# Patient Record
Sex: Female | Born: 1995 | Race: Black or African American | Hispanic: No | Marital: Single | Smoking: Never smoker
Health system: Southern US, Community
[De-identification: ages and names within clinical notes are randomized; demographics above are authoritative.]

## PROBLEM LIST (undated history)

## (undated) ENCOUNTER — Inpatient Hospital Stay (HOSPITAL_COMMUNITY): Payer: Self-pay

## (undated) DIAGNOSIS — J45909 Unspecified asthma, uncomplicated: Secondary | ICD-10-CM

## (undated) DIAGNOSIS — D649 Anemia, unspecified: Secondary | ICD-10-CM

## (undated) HISTORY — PX: NO PAST SURGERIES: SHX2092

---
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
## 2014-01-04 ENCOUNTER — Ambulatory Visit (INDEPENDENT_AMBULATORY_CARE_PROVIDER_SITE_OTHER): Payer: Medicaid Other | Admitting: Obstetrics & Gynecology

## 2014-01-04 ENCOUNTER — Encounter: Payer: Self-pay | Admitting: Obstetrics & Gynecology

## 2014-01-04 DIAGNOSIS — N760 Acute vaginitis: Secondary | ICD-10-CM

## 2014-01-04 MED ORDER — METRONIDAZOLE 500 MG PO TABS: 500.0000 mg | ORAL_TABLET | Freq: Two times a day (BID) | ORAL | Status: DC

## 2014-01-04 NOTE — Patient Instructions (Signed)

## 2014-01-04 NOTE — Progress Notes (Signed)
Patient ID: [place], [place]   DOB: 05-01-1996, 18 y.o.   MRN: [id number]  C/O vaginal discharge with an order  HPI [name] is a 18 y.o. female. H/O genital HSV.  C/O swollen labia.  HPI  No past medical history on file.  Past Surgical History  Procedure Laterality Date  . No past surgeries      Family History  Problem Relation Age of Onset  . Hypertension    . Diabetes    . Cancer Paternal Grandmother     Social History History  Substance Use Topics  . Smoking status: Never Smoker   . Smokeless tobacco: Never Used  . Alcohol Use: Yes     Comment: occ    No Known Allergies  Current Outpatient Prescriptions  Medication Sig Dispense Refill  . ibuprofen (ADVIL,MOTRIN) 600 MG tablet Take 1,200-1,800 mg by mouth every 6 (six) hours as needed. For pain      . medroxyPROGESTERone (DEPO-PROVERA) 150 MG/ML injection Inject 1 mL (150 mg total) into the muscle once.  1 mL  2  . traMADol (ULTRAM) 50 MG tablet Take 1 tablet (50 mg total) by mouth every 6 (six) hours as needed.  15 tablet  0   Current Facility-Administered Medications  Medication Dose Route Frequency Provider Last Rate Last Dose  . medroxyPROGESTERone (DEPO-PROVERA) injection 150 mg  150 mg Intramuscular Q90 days Brock Badharles A Harper, MD   150 mg at 11/14/13 1026    Review of Systems Review of Systems Constitutional: negative for fatigue and weight loss Respiratory: negative for cough and wheezing Cardiovascular: negative for chest pain, fatigue and palpitations Gastrointestinal: negative for abdominal pain and change in bowel habits Genitourinary: positive for abnormal discharge Integument/breast: negative for nipple discharge Musculoskeletal:negative for myalgias Neurological: negative for gait problems and tremors Behavioral/Psych: negative for abusive relationship, depression Endocrine: negative for temperature intolerance     There were no vitals taken for this visit.  Physical  Exam Physical Exam General:   alert  Skin:   no rash or abnormalities  Lungs:   clear to auscultation bilaterally  Heart:   regular rate and rhythm, S1, S2 normal, no murmur, click, rub or gallop  Breasts:   normal without suspicious masses, skin or nipple changes or axillary nodes  Abdomen:  normal findings: no organomegaly, soft, non-tender and no hernia  Pelvis:  External genitalia: raised, gray lesion < 1 at posterior fourchette Urinary system: urethral meatus normal and bladder without fullness, nontender Vaginal: scant, white discharge Cervix: normal appearance Adnexa: normal bimanual exam Uterus: anteverted and non-tender, normal size      Data Reviewed None  Assessment    Condylomata ?Vulvovaginitis    Plan   .meds Orders Placed This Encounter  Procedures  . WET PREP BY MOLECULAR PROBE  . GC/Chlamydia Probe Amp   Meds ordered this encounter  Medications  . metroNIDAZOLE (FLAGYL) 500 MG tablet    Sig: Take 1 tablet (500 mg total) by mouth 2 (two) times daily.    Dispense:  14 tablet    Refill:  0   Discontinue shaving of pubic hair  Follow up as needed.         JACKSON-MOORE,Hien Cunliffe A 01/04/2014, 11:35 AM

## 2014-01-05 LAB — WET PREP BY MOLECULAR PROBE
Candida species: NEGATIVE
Gardnerella vaginalis: POSITIVE — AB
Trichomonas vaginosis: NEGATIVE

## 2014-01-05 LAB — GC/CHLAMYDIA PROBE AMP
CT PROBE, AMP APTIMA: NEGATIVE
GC Probe RNA: NEGATIVE

## 2014-02-05 ENCOUNTER — Ambulatory Visit: Payer: Medicaid Other

## 2014-02-05 ENCOUNTER — Other Ambulatory Visit: Payer: Self-pay | Admitting: *Deleted

## 2014-02-05 DIAGNOSIS — Z3042 Encounter for surveillance of injectable contraceptive: Secondary | ICD-10-CM

## 2014-02-05 MED ORDER — MEDROXYPROGESTERONE ACETATE 150 MG/ML IM SUSP: 150.0000 mg | Freq: Once | INTRAMUSCULAR | Status: DC

## 2014-02-06 ENCOUNTER — Ambulatory Visit (INDEPENDENT_AMBULATORY_CARE_PROVIDER_SITE_OTHER): Payer: Medicaid Other | Admitting: *Deleted

## 2014-02-06 VITALS — BP 130/79 | HR 81 | Temp 98.6°F | Ht 60.0 in | Wt 118.0 lb

## 2014-02-06 DIAGNOSIS — Z309 Encounter for contraceptive management, unspecified: Secondary | ICD-10-CM

## 2014-02-06 DIAGNOSIS — Z3049 Encounter for surveillance of other contraceptives: Secondary | ICD-10-CM

## 2014-02-06 NOTE — Progress Notes (Signed)
Patient in office for a Depo Injection. Patient is on time for her Depo Injection. Patient tolerated injection well.  Patient due back in office April 30, 2014 for next injection.   BP 130/79  Pulse 81  Temp(Src) 98.6 F (37 C)  Ht 5' (1.524 m)  Wt 118 lb (53.524 kg)  BMI 23.05 kg/m2  LMP 02/03/2014  Administrations This Visit   medroxyPROGESTERone (DEPO-PROVERA) injection 150 mg   Administered Action Dose Route Administered By   02/06/2014 Given 150 mg Intramuscular Shelda Pal, LPN

## 2014-03-06 ENCOUNTER — Telehealth: Payer: Self-pay | Admitting: *Deleted

## 2014-03-06 NOTE — Telephone Encounter (Signed)
Pharmacy faxed over a refill request for Ibuprofen 600 MG Tablets, Qty: 30 with 5 Refills. Please advise on Refill request.

## 2014-03-09 ENCOUNTER — Other Ambulatory Visit: Payer: Self-pay | Admitting: *Deleted

## 2014-03-09 MED ORDER — IBUPROFEN 800 MG PO TABS: 800.0000 mg | ORAL_TABLET | Freq: Three times a day (TID) | ORAL | Status: DC | PRN

## 2014-03-10 NOTE — Telephone Encounter (Signed)
OK to refill

## 2014-03-13 NOTE — Telephone Encounter (Signed)
Refills sent by [name] on 03/09/14.

## 2014-04-24 ENCOUNTER — Emergency Department (HOSPITAL_COMMUNITY)
Admission: EM | Admit: 2014-04-24 | Discharge: 2014-04-24 | Disposition: A | Discharge status: Home / Self Care | Payer: Medicaid Other | Attending: Emergency Medicine | Admitting: Emergency Medicine

## 2014-04-24 ENCOUNTER — Encounter (HOSPITAL_COMMUNITY): Payer: Self-pay | Admitting: Emergency Medicine

## 2014-04-24 DIAGNOSIS — R51 Headache: Secondary | ICD-10-CM | POA: Diagnosis not present

## 2014-04-24 DIAGNOSIS — B3731 Acute candidiasis of vulva and vagina: Secondary | ICD-10-CM

## 2014-04-24 DIAGNOSIS — B373 Candidiasis of vulva and vagina: Secondary | ICD-10-CM | POA: Insufficient documentation

## 2014-04-24 DIAGNOSIS — Z3202 Encounter for pregnancy test, result negative: Secondary | ICD-10-CM | POA: Diagnosis not present

## 2014-04-24 DIAGNOSIS — Z7952 Long term (current) use of systemic steroids: Secondary | ICD-10-CM | POA: Diagnosis not present

## 2014-04-24 DIAGNOSIS — R040 Epistaxis: Secondary | ICD-10-CM | POA: Diagnosis not present

## 2014-04-24 DIAGNOSIS — N390 Urinary tract infection, site not specified: Secondary | ICD-10-CM | POA: Insufficient documentation

## 2014-04-24 DIAGNOSIS — R635 Abnormal weight gain: Secondary | ICD-10-CM | POA: Insufficient documentation

## 2014-04-24 DIAGNOSIS — R109 Unspecified abdominal pain: Secondary | ICD-10-CM | POA: Diagnosis present

## 2014-04-24 LAB — COMPREHENSIVE METABOLIC PANEL
ALBUMIN: 4.5 g/dL (ref 3.5–5.2)
ALT: 10 U/L (ref 0–35)
AST: 15 U/L (ref 0–37)
Alkaline Phosphatase: 69 U/L (ref 39–117)
Anion gap: 14 (ref 5–15)
BILIRUBIN TOTAL: 0.8 mg/dL (ref 0.3–1.2)
BUN: 17 mg/dL (ref 6–23)
CHLORIDE: 105 meq/L (ref 96–112)
CO2: 22 meq/L (ref 19–32)
Calcium: 10.1 mg/dL (ref 8.4–10.5)
Creatinine, Ser: 1.08 mg/dL (ref 0.50–1.10)
GFR calc Af Amer: 86 mL/min — ABNORMAL LOW (ref >90)
GFR, EST NON AFRICAN AMERICAN: 74 mL/min — AB (ref >90)
Glucose, Bld: 96 mg/dL (ref 70–99)
POTASSIUM: 4.1 meq/L (ref 3.7–5.3)
SODIUM: 141 meq/L (ref 137–147)
Total Protein: 8.2 g/dL (ref 6.0–8.3)

## 2014-04-24 LAB — CBC WITH DIFFERENTIAL/PLATELET
BASOS ABS: 0 10*3/uL (ref 0.0–0.1)
Basophils Relative: 0 % (ref 0–1)
Eosinophils Absolute: 0.1 10*3/uL (ref 0.0–0.7)
Eosinophils Relative: 1 % (ref 0–5)
HEMATOCRIT: 40.6 % (ref 36.0–46.0)
Hemoglobin: 13.3 g/dL (ref 12.0–15.0)
LYMPHS PCT: 45 % (ref 12–46)
Lymphs Abs: 2.3 10*3/uL (ref 0.7–4.0)
MCH: 27.1 pg (ref 26.0–34.0)
MCHC: 32.8 g/dL (ref 30.0–36.0)
MCV: 82.7 fL (ref 78.0–100.0)
Monocytes Absolute: 0.3 10*3/uL (ref 0.1–1.0)
Monocytes Relative: 6 % (ref 3–12)
NEUTROS ABS: 2.5 10*3/uL (ref 1.7–7.7)
NEUTROS PCT: 48 % (ref 43–77)
PLATELETS: 237 10*3/uL (ref 150–400)
RBC: 4.91 MIL/uL (ref 3.87–5.11)
RDW: 14 % (ref 11.5–15.5)
WBC: 5.2 10*3/uL (ref 4.0–10.5)

## 2014-04-24 LAB — URINALYSIS, ROUTINE W REFLEX MICROSCOPIC
Bilirubin Urine: NEGATIVE
GLUCOSE, UA: NEGATIVE mg/dL
Hgb urine dipstick: NEGATIVE
Ketones, ur: NEGATIVE mg/dL
NITRITE: NEGATIVE
PROTEIN: NEGATIVE mg/dL
Specific Gravity, Urine: 1.028 (ref 1.005–1.030)
UROBILINOGEN UA: 0.2 mg/dL (ref 0.0–1.0)
pH: 5.5 (ref 5.0–8.0)

## 2014-04-24 LAB — URINE MICROSCOPIC-ADD ON

## 2014-04-24 LAB — WET PREP, GENITAL
Clue Cells Wet Prep HPF POC: NONE SEEN
Trich, Wet Prep: NONE SEEN
WBC WET PREP: NONE SEEN
Yeast Wet Prep HPF POC: NONE SEEN

## 2014-04-24 LAB — POC URINE PREG, ED: Preg Test, Ur: NEGATIVE

## 2014-04-24 MED ORDER — FLUCONAZOLE 150 MG PO TABS: 150.0000 mg | ORAL_TABLET | Freq: Once | ORAL | Status: DC

## 2014-04-24 MED ORDER — CEPHALEXIN 500 MG PO CAPS: 500.0000 mg | ORAL_CAPSULE | Freq: Four times a day (QID) | ORAL | Status: DC

## 2014-04-24 NOTE — ED Notes (Signed)
Per pt, states abdominal pain, headache, increased urination since Depo shot in August

## 2014-04-24 NOTE — Discharge Instructions (Signed)
Candida Infection A Candida infection (also called yeast, fungus, and Monilia infection) is an overgrowth of yeast that can occur anywhere on the body. A yeast infection commonly occurs in warm, moist body areas. Usually, the infection remains localized but can spread to become a systemic infection. A yeast infection may be a sign of a more severe disease such as diabetes, leukemia, or AIDS. A yeast infection can occur in both men and women. In women, Candida vaginitis is a vaginal infection. It is one of the most common causes of vaginitis. Men usually do not have symptoms or know they have an infection until other problems develop. Men may find out they have a yeast infection because their sex partner has a yeast infection. Uncircumcised men are more likely to get a yeast infection than circumcised men. This is because the uncircumcised glans is not exposed to air and does not remain as dry as that of a circumcised glans. Older adults may develop yeast infections around dentures. CAUSES  Women  Antibiotics.  Steroid medication taken for a long time.  Being overweight (obese).  Diabetes.  Poor immune condition.  Certain serious medical conditions.  Immune suppressive medications for organ transplant patients.  Chemotherapy.  Pregnancy.  Menstruation.  Stress and fatigue.  Intravenous drug use.  Oral contraceptives.  Wearing tight-fitting clothes in the crotch area.  Catching it from a sex partner who has a yeast infection.  Spermicide.  Intravenous, urinary, or other catheters. Men  Catching it from a sex partner who has a yeast infection.  Having oral or anal sex with a person who has the infection.  Spermicide.  Diabetes.  Antibiotics.  Poor immune system.  Medications that suppress the immune system.  Intravenous drug use.  Intravenous, urinary, or other catheters. SYMPTOMS  Women  Thick, white vaginal discharge.  Vaginal itching.  Redness and  swelling in and around the vagina.  Irritation of the lips of the vagina and perineum.  Blisters on the vaginal lips and perineum.  Painful sexual intercourse.  Low blood sugar (hypoglycemia).  Painful urination.  Bladder infections.  Intestinal problems such as constipation, indigestion, bad breath, bloating, increase in gas, diarrhea, or loose stools. Men  Men may develop intestinal problems such as constipation, indigestion, bad breath, bloating, increase in gas, diarrhea, or loose stools.  Dry, cracked skin on the penis with itching or discomfort.  Jock itch.  Dry, flaky skin.  Athlete's foot.  Hypoglycemia. DIAGNOSIS  Women  A history and an exam are performed.  The discharge may be examined under a microscope.  A culture may be taken of the discharge. Men  A history and an exam are performed.  Any discharge from the penis or areas of cracked skin will be looked at under the microscope and cultured.  Stool samples may be cultured. TREATMENT  Women  Vaginal antifungal suppositories and creams.  Medicated creams to decrease irritation and itching on the outside of the vagina.  Warm compresses to the perineal area to decrease swelling and discomfort.  Oral antifungal medications.  Medicated vaginal suppositories or cream for repeated or recurrent infections.  Wash and dry the irritation areas before applying the cream.  Eating yogurt with Lactobacillus may help with prevention and treatment.  Sometimes painting the vagina with gentian violet solution may help if creams and suppositories do not work. Men  Antifungal creams and oral antifungal medications.  Sometimes treatment must continue for 30 days after the symptoms go away to prevent recurrence. HOME CARE INSTRUCTIONS  Women  Use cotton underwear and avoid tight-fitting clothing.  Avoid colored, scented toilet paper and deodorant tampons or pads.  Do not douche.  Keep your diabetes  under control.  Finish all the prescribed medications.  Keep your skin clean and dry.  Consume milk or yogurt with Lactobacillus-active culture regularly. If you get frequent yeast infections and think that is what the infection is, there are over-the-counter medications that you can get. If the infection does not show healing in 3 days, talk to your caregiver.  Tell your sex partner you have a yeast infection. Your partner may need treatment also, especially if your infection does not clear up or recurs. Men  Keep your skin clean and dry.  Keep your diabetes under control.  Finish all prescribed medications.  Tell your sex partner that you have a yeast infection so he or she can be treated if necessary. SEEK MEDICAL CARE IF:   Your symptoms do not clear up or worsen in one week after treatment.  You have an oral temperature above 102 F (38.9 C).  You have trouble swallowing or eating for a prolonged time.  You develop blisters on and around your vagina.  You develop vaginal bleeding and it is not your menstrual period.  You develop abdominal pain.  You develop intestinal problems as mentioned above.  You get weak or light-headed.  You have painful or increased urination.  You have pain during sexual intercourse. MAKE SURE YOU:   Understand these instructions.  Will watch your condition.  Will get help right away if you are not doing well or get worse. Document Released: 07/09/2004 Document Revised: 10/16/2013 Document Reviewed: 10/21/2009 [facility] Patient Information 2015 [place], [place]. This information is not intended to replace advice given to you by your health care provider. Make sure you discuss any questions you have with your health care provider.  Urinary Tract Infection Urinary tract infections (UTIs) can develop anywhere along your urinary tract. Your urinary tract is your body's drainage system for removing wastes and extra water. Your urinary tract  includes two kidneys, two ureters, a bladder, and a urethra. Your kidneys are a pair of bean-shaped organs. Each kidney is about the size of your fist. They are located below your ribs, one on each side of your spine. CAUSES Infections are caused by microbes, which are microscopic organisms, including fungi, viruses, and bacteria. These organisms are so small that they can only be seen through a microscope. Bacteria are the microbes that most commonly cause UTIs. SYMPTOMS  Symptoms of UTIs may vary by age and gender of the patient and by the location of the infection. Symptoms in young women typically include a frequent and intense urge to urinate and a painful, burning feeling in the bladder or urethra during urination. Older women and men are more likely to be tired, shaky, and weak and have muscle aches and abdominal pain. A fever may mean the infection is in your kidneys. Other symptoms of a kidney infection include pain in your back or sides below the ribs, nausea, and vomiting. DIAGNOSIS To diagnose a UTI, your caregiver will ask you about your symptoms. Your caregiver also will ask to provide a urine sample. The urine sample will be tested for bacteria and white blood cells. White blood cells are made by your body to help fight infection. TREATMENT  Typically, UTIs can be treated with medication. Because most UTIs are caused by a bacterial infection, they usually can be treated with the use  of antibiotics. The choice of antibiotic and length of treatment depend on your symptoms and the type of bacteria causing your infection. HOME CARE INSTRUCTIONS  If you were prescribed antibiotics, take them exactly as your caregiver instructs you. Finish the medication even if you feel better after you have only taken some of the medication.  Drink enough water and fluids to keep your urine clear or pale yellow.  Avoid caffeine, tea, and carbonated beverages. They tend to irritate your bladder.  Empty  your bladder often. Avoid holding urine for long periods of time.  Empty your bladder before and after sexual intercourse.  After a bowel movement, women should cleanse from front to back. Use each tissue only once. SEEK MEDICAL CARE IF:   You have back pain.  You develop a fever.  Your symptoms do not begin to resolve within 3 days. SEEK IMMEDIATE MEDICAL CARE IF:   You have severe back pain or lower abdominal pain.  You develop chills.  You have nausea or vomiting.  You have continued burning or discomfort with urination. MAKE SURE YOU:   Understand these instructions.  Will watch your condition.  Will get help right away if you are not doing well or get worse. Document Released: 03/11/2005 Document Revised: 12/01/2011 Document Reviewed: 07/10/2011 [facility] Patient Information 2015 [place]. This information is not intended to replace advice given to you by your health care provider. Make sure you discuss any questions you have with your health care provider.   Emergency Department Resource Guide 1) Find a Doctor and Pay Out of Pocket Although you won't have to find out who is covered by your insurance plan, it is a good idea to ask around and get recommendations. You will then need to call the office and see if the doctor you have chosen will accept you as a new patient and what types of options they offer for patients who are self-pay. Some doctors offer discounts or will set up payment plans for their patients who do not have insurance, but you will need to ask so you aren't surprised when you get to your appointment.  2) Contact Your Local Health Department Not all health departments have doctors that can see patients for sick visits, but many do, so it is worth a call to see if yours does. If you don't know where your local health department is, you can check in your phone book. The CDC also has a tool to help you locate your state's health department, and many  state websites also have listings of all of their local health departments.  3) Find a Walk-in Clinic If your illness is not likely to be very severe or complicated, you may want to try a walk in clinic. These are popping up all over the country in pharmacies, drugstores, and shopping centers. They're usually staffed by nurse practitioners or physician assistants that have been trained to treat common illnesses and complaints. They're usually fairly quick and inexpensive. However, if you have serious medical issues or chronic medical problems, these are probably not your best option.  No Primary Care Doctor: - Call Health Connect at  [phone] - they can help you locate a primary care doctor that  accepts your insurance, provides certain services, etc. - Physician Referral Service- [phone]  Chronic Pain Problems: Organization         Address  Phone   Notes  [facility]  [phone] Patients need to be referred by their  primary care doctor.   Medication Assistance: Organization         Address  Phone   Notes  [facility] Medication [facility] [address] [place] [place], [place] [address] [phone] --Must be a resident of [facility] -- Must have NO insurance coverage whatsoever (no Medicaid/ Medicare, etc.) -- The pt. MUST have a primary care doctor that directs their care regularly and follows them in the community   Med[facility]ist  [phone]   [employer]  [phone]    Agencies that provide inexpensive medical care: Organization         Address  Phone   Notes  [facility]  [phone]   [facility]    [phone]   [facility] [address] [place], [place] [address] [phone]   [facility] [place] [address] [place] [place] [phone]   Planned Parenthood    [phone]   [facility]    [phone]   Community Health and  [facility]  [address], [place] Phone:  [phone], Fax:  [phone] Hours of Operation:  9 am - 6 pm, M-F.  Also accepts Medicaid/Medicare and self-pay.  [facility]  [address], [place] Phone: [phone], Fax: [phone]. Hours of Operation:  8:30 am - 5:30 pm, M-F.  Also accepts Medicaid and self-pay.  [facility] [place] [address], [place] Phone: [phone]   [facility] [address] Natasha Bence [place], [place] [phone], Ext. [phone] Mondays & Thursdays: 7-9 AM.  First 15 patients are seen on a first come, first serve basis.    Medicaid-accepting [facility] Providers:  Organization         Address  Phone   Notes  [facility] [address], [place] [phone] Also accepts self-pay patients.  [facility] [address] Laurell Josephs [place], [place]  [phone]   [facility] [address], [place] [phone]   [facility] [address] [place] [phone]   Renaye Rakers [address], [place]   [phone] Only accepts [place] patients after they have their name applied to their card.   Self-Pay (no insurance) in [facility]:  Organization         Address  Phone   Notes  Sickle Cell Patients, [facility] [address] [place], [place] [phone]   [facility] [address] [place], [place] [phone]   [facility] [place]  [address], [place] [phone]   [facility]/Dr. Osei-Bonsu  [address], [place] or [address], [place] [phone] Phone number for both [place] and [place] locations is the same.  [facility] [address], [place] [phone]   [facility] [address] [place] or [address] [phone]   [facility] [address] [place] [phone], phone; [phone], fax Sees patients 1st and 3rd Saturday of every month.  Must not qualify for public or private insurance (i.e. Medicaid, Medicare, [place] Health Choice, Veterans' Benefits)  Household income should be no more than 200%  of the poverty level The clinic cannot treat you if you are pregnant or think you are pregnant  Sexually transmitted diseases are not treated at the clinic.    Dental Care: Organization         Address  Phone  Notes  [facility] Department of [facility] [address] [place], [place] [phone] Accepts children up to age 42 who are enrolled in [place] or [place] Health Choice; pregnant women with a Medicaid card; and children who have applied for Medicaid or [place] Health Choice, but were declined, whose parents can pay a reduced fee at time of service.  [facility] Department of [facility]  [address], [place] [phone] Accepts children up to age 71 who are enrolled in [place] or [place] Health Choice; pregnant women with a Medicaid card; and children who have applied for Medicaid or [place] Health Choice, but were declined, whose parents can pay a reduced fee at time of service.  [facility]  [address] [place], [place] [phone] Patients are seen by appointment only. Walk-ins are not accepted. [facility] will see patients 55 years of age and older. Monday - Tuesday (8am-5pm) Most Wednesdays (8:30-5pm) $30 per visit, cash only  [facility] Adult Dental Access PRO[facility]  [address], [facility] [phone] Patients are seen by appointment only. Walk-ins are not accepted. [facility] will see patients 61 years of age and older. One Wednesday Evening (Monthly: Volunteer Based).  $30 per visit, cash only  [employer]  [phone] for adults; Children under age 51, call Graduate Pediatric Dentistry at [phone]. Children aged 4-14, please call [phone] to request a pediatric application.  Dental services are provided in all areas of dental care including fillings, crowns and bridges, complete and partial dentures, implants, gum treatment, root canals, and extractions. Preventive care is also provided. Treatment is provided to both adults and children. Patients are selected via a lottery and there is often a waiting list.   [facility] [address], [place]  [phone] [email or web address]   [facility] [address] [place], [place] [phone], Ext. [phone] Second and Fourth Thursday of each month, opens at 6:30 AM; Clinic ends at 9 AM.  Patients are seen on a first-come first-served basis, and a limited number are seen during each clinic.   [facility]  [address] Ether Griffins [place], [place] [phone]   Eligibility Requirements You must have lived in [place], [place], or [place] counties for at least the last three months.   You cannot be eligible for state or federal sponsored [place], including [employer], [place], or [employer].   You generally cannot be eligible for healthcare insurance through your employer.    How to apply: Eligibility screenings are held every Tuesday and Wednesday afternoon from 1:00 pm until 4:00 pm. You do not need an appointment for the interview!  [facility] [address], [place], [place] [phone]   [facility] Health Department  [phone]   [facility] Department  [phone]   [facility] Health Department  [phone]    Behavioral Health Resources in the Community: Intensive Outpatient Programs Organization         Address  Phone  Notes  [facility] [address], [place], [place]  [phone]   [facility]patient  [address], [place], [place] [phone]   [facility] [address], [place], [place]  [phone]   [facility] [address],  [place], [place] [phone] or [phone]   Substance Abuse Resources Organization         Address  Phone  Notes  [facility]  [phone]   [facility]  [phone]   [facility]  [phone]   [facility]  [phone]   Residential & Outpatient Substance Abuse Program  [phone]   Psychological Services Organization         Address  Phone  Notes  [facility]  [phone]   [facility]  [phone]- [phone]   [facility] [address], [place] [phone] or [phone]    Mobile Crisis Teams Organization         Address  Phone  Notes  [facility], Mobile Crisis Care Unit  [phone]   [facility]  [address] [place], [place] [phone]   Doristine LocksSharon DeEsch [address] [place] [phone]    Self-Help/Support Groups Organization         Address  Phone             Notes  [facility] [place] - variety of support groups  336- [phone] Call for more information  Narcotics Anonymous (NA), [facility] [address], [place]  2 meetings at this location   Statisticianesidential Treatment Programs Organization         Address  Phone  Notes  [facility] [address],    [place]  [phone]   [facility]  [address], [place] [phone], [place], [place] [phone]   [facility] [address], [place] [phone] Admissions: 8am-3pm M-F  [facility] [address]    [place], [place] [phone]   [facility] [address], [place], [place] [phone]   [facility] [address]    [place], [place] [phone]   [facility] - Intensive Outpatient [address], [place], [place] [phone]   [facility] ([facility] [address]  [place], [place] [phone] or [phone]   [facility] ([facility]) [address] [place], [place] [phone] Accepts Medicaid  [facility] [address]  [place] [phone] Substance Abuse/Addiction Treatment   [facility] Health Resources Organization         Address  Phone  Notes  [facility]  [phone]   Angie FavaJulie Brannon, PhD [address], [address] [place], [place]   [phone] or [phone]   [facility]   [address] [place], [place] [phone]   [facility] [address], [place], [place] [phone] Insurance/Medicaid/sponsorship through [facility]  [facility] [address]                                    [place], [place] [phone] Therapy/tele-psych/case  [facility] [address].   [place], [place] [phone]    Dr. Lolly MustacheArfeen  [phone]   [facility] [place]  [employer] [facility] 1) [address], [place] 2) [address], [place] 3)  [address] [place] [address], [place] [phone] °([phone] ° °([phone]   °[place] Child Abuse Hotline (336) [phone] or [phone] (After Hours)    ° ° ° °

## 2014-04-24 NOTE — ED Provider Notes (Signed)
CSN: [id number]     Arrival date & time 04/24/14  1122 History   First MD Initiated Contact with Patient 04/24/14 1233     Chief Complaint  Patient presents with  . Abdominal Pain     (Consider location/radiation/quality/duration/timing/severity/associated sxs/prior Treatment) HPI Comments: Pt presents with multiple complaints including suprapubic pain, dysuria, frequency, nose bleeds, headache, weight gain, hunger, vaginal discharge since starting depo in Aug.  The history is provided by the patient. No language interpreter was used.    History reviewed. No pertinent past medical history. Past Surgical History  Procedure Laterality Date  . No past surgeries     Family History  Problem Relation Age of Onset  . Hypertension    . Diabetes    . Cancer Paternal Grandmother    History  Substance Use Topics  . Smoking status: Never Smoker   . Smokeless tobacco: Never Used  . Alcohol Use: Yes     Comment: occ   OB History    Gravida Para Term Preterm AB TAB SAB Ectopic Multiple Living   0 0 0 0 0 0 0 0 0 0      Review of Systems    Allergies  Review of patient's allergies indicates no known allergies.  Home Medications   Prior to Admission medications   Medication Sig Start Date End Date Taking? Authorizing Provider  ibuprofen (ADVIL,MOTRIN) 800 MG tablet Take 1 tablet (800 mg total) by mouth every 8 (eight) hours as needed. 03/09/14  Yes Brock Badharles A Harper, MD  medroxyPROGESTERone (DEPO-PROVERA) 150 MG/ML injection Inject 1 mL (150 mg total) into the muscle once. 02/05/14  Yes Antionette CharLisa Jackson-Moore, MD  cephALEXin (KEFLEX) 500 MG capsule Take 1 capsule (500 mg total) by mouth 4 (four) times daily. 04/24/14   Toy CookeyMegan Renea Schoonmaker, MD  fluconazole (DIFLUCAN) 150 MG tablet Take 1 tablet (150 mg total) by mouth once. 04/24/14   Toy CookeyMegan Michaelia Beilfuss, MD   BP 118/64 mmHg  Pulse 71  Temp(Src) 98.5 F (36.9 C) (Oral)  Resp 16  SpO2 100%  LMP 03/01/2014 Physical Exam  Genitourinary:  Cervix exhibits discharge (thick, clumpy, white). Cervix exhibits no motion tenderness and no friability.    ED Course  Procedures (including critical care time) Labs Review Labs Reviewed  URINALYSIS, ROUTINE W REFLEX MICROSCOPIC - Abnormal; Notable for the following:    APPearance CLOUDY (*)    Leukocytes, UA MODERATE (*)    All other components within normal limits  COMPREHENSIVE METABOLIC PANEL - Abnormal; Notable for the following:    GFR calc non Af Amer 74 (*)    GFR calc Af Amer 86 (*)    All other components within normal limits  WET PREP, GENITAL  GC/CHLAMYDIA PROBE AMP  CBC WITH DIFFERENTIAL  URINE MICROSCOPIC-ADD ON  POC URINE PREG, ED    Imaging Review No results found.   EKG Interpretation None      MDM   Final diagnoses:  UTI (lower urinary tract infection)  Candidiasis of female genitalia    Pt is a 18 y.o. female with Pmhx as above who presents with multiple complaints including suprapubic pain, dysuria, frequency, nose bleeds, headache, weight gain, hunger, vaginal discharge since starting depo in Aug. On PE, VSS, pt in NAD. Cardiopulm & abdominal exam benign. Pelvic with thick, white clumpy discharge c/w candidiasis (will treat thought wet prep neg). UA likely infected. CBC, CMP grossly unremarkable. I have had discussion with pt about alternative forms of contraceptions and will rec  She f/u with her  GYN. Will also treat for UTI. Return precautions given for new or worsening symptoms including worsening pain, fever, inability to tolerate liquids.          Toy CookeyMegan Sacha Radloff, MD 04/24/14 2038

## 2014-04-25 LAB — GC/CHLAMYDIA PROBE AMP
CT PROBE, AMP APTIMA: NEGATIVE
GC PROBE AMP APTIMA: NEGATIVE

## 2014-04-30 ENCOUNTER — Ambulatory Visit: Payer: Self-pay

## 2014-05-01 ENCOUNTER — Ambulatory Visit: Payer: Self-pay

## 2014-06-04 ENCOUNTER — Emergency Department (HOSPITAL_COMMUNITY)
Admission: EM | Admit: 2014-06-04 | Discharge: 2014-06-04 | Disposition: A | Discharge status: Home / Self Care | Payer: Medicaid Other | Attending: Emergency Medicine | Admitting: Emergency Medicine

## 2014-06-04 ENCOUNTER — Encounter (HOSPITAL_COMMUNITY): Payer: Self-pay | Admitting: *Deleted

## 2014-06-04 DIAGNOSIS — Z79899 Other long term (current) drug therapy: Secondary | ICD-10-CM | POA: Insufficient documentation

## 2014-06-04 DIAGNOSIS — Z3202 Encounter for pregnancy test, result negative: Secondary | ICD-10-CM | POA: Diagnosis not present

## 2014-06-04 DIAGNOSIS — Z792 Long term (current) use of antibiotics: Secondary | ICD-10-CM | POA: Diagnosis not present

## 2014-06-04 DIAGNOSIS — N898 Other specified noninflammatory disorders of vagina: Secondary | ICD-10-CM | POA: Insufficient documentation

## 2014-06-04 LAB — URINE MICROSCOPIC-ADD ON

## 2014-06-04 LAB — URINALYSIS, ROUTINE W REFLEX MICROSCOPIC
BILIRUBIN URINE: NEGATIVE
GLUCOSE, UA: NEGATIVE mg/dL
KETONES UR: NEGATIVE mg/dL
Nitrite: NEGATIVE
PROTEIN: NEGATIVE mg/dL
Specific Gravity, Urine: 1.027 (ref 1.005–1.030)
Urobilinogen, UA: 0.2 mg/dL (ref 0.0–1.0)
pH: 8 (ref 5.0–8.0)

## 2014-06-04 LAB — WET PREP, GENITAL
TRICH WET PREP: NONE SEEN
YEAST WET PREP: NONE SEEN

## 2014-06-04 LAB — POC URINE PREG, ED: Preg Test, Ur: NEGATIVE

## 2014-06-04 MED ORDER — CEFTRIAXONE SODIUM 250 MG IJ SOLR
250.0000 mg | Freq: Once | INTRAMUSCULAR | Status: AC
  Administered 2014-06-04: 250 mg via INTRAMUSCULAR
  Filled 2014-06-04: qty 250

## 2014-06-04 MED ORDER — LIDOCAINE HCL 1 % IJ SOLN
INTRAMUSCULAR | Status: AC
  Administered 2014-06-04: 2 mL
  Filled 2014-06-04: qty 20

## 2014-06-04 MED ORDER — AZITHROMYCIN 250 MG PO TABS
1000.0000 mg | ORAL_TABLET | Freq: Once | ORAL | Status: AC
  Administered 2014-06-04: 1000 mg via ORAL
  Filled 2014-06-04: qty 4

## 2014-06-04 MED ORDER — METRONIDAZOLE 500 MG PO TABS: 500.0000 mg | ORAL_TABLET | Freq: Two times a day (BID) | ORAL | Status: DC

## 2014-06-04 MED ORDER — FLUCONAZOLE 150 MG PO TABS: 150.0000 mg | ORAL_TABLET | Freq: Once | ORAL | Status: DC

## 2014-06-04 NOTE — Discharge Instructions (Signed)
Sexually Transmitted Disease °A sexually transmitted disease (STD) is a disease or infection that may be passed (transmitted) from person to person, usually during sexual activity. This may happen by way of saliva, semen, blood, vaginal mucus, or urine. Common STDs include:  °· Gonorrhea.   °· Chlamydia.   °· Syphilis.   °· HIV and AIDS.   °· Genital herpes.   °· Hepatitis B and C.   °· Trichomonas.   °· Human papillomavirus (HPV).   °· Pubic lice.   °· Scabies. °· Mites. °· Bacterial vaginosis. °WHAT ARE CAUSES OF STDs? °An STD may be caused by bacteria, a virus, or parasites. STDs are often transmitted during sexual activity if one person is infected. However, they may also be transmitted through nonsexual means. STDs may be transmitted after:  °· Sexual intercourse with an infected person.   °· Sharing sex toys with an infected person.   °· Sharing needles with an infected person or using unclean piercing or tattoo needles. °· Having intimate contact with the genitals, mouth, or rectal areas of an infected person.   °· Exposure to infected fluids during birth. °WHAT ARE THE SIGNS AND SYMPTOMS OF STDs? °Different STDs have different symptoms. Some people may not have any symptoms. If symptoms are present, they may include:  °· Painful or bloody urination.   °· Pain in the pelvis, abdomen, vagina, anus, throat, or eyes.   °· A skin rash, itching, or irritation. °· Growths, ulcerations, blisters, or sores in the genital and anal areas. °· Abnormal vaginal discharge with or without bad odor.   °· Penile discharge in men.   °· Fever.   °· Pain or bleeding during sexual intercourse.   °· Swollen glands in the groin area.   °· Yellow skin and eyes (jaundice). This is seen with hepatitis.   °· Swollen testicles. °· Infertility. °· Sores and blisters in the mouth. °HOW ARE STDs DIAGNOSED? °To make a diagnosis, your health care provider may:  °· Take a medical history.   °· Perform a physical exam.   °· Take a sample of  any discharge to examine. °· Swab the throat, cervix, opening to the penis, rectum, or vagina for testing. °· Test a sample of your first morning urine.   °· Perform blood tests.   °· Perform a Pap test, if this applies.   °· Perform a colposcopy.   °· Perform a laparoscopy.   °HOW ARE STDs TREATED? ° Treatment depends on the STD. Some STDs may be treated but not cured.  °· Chlamydia, gonorrhea, trichomonas, and syphilis can be cured with antibiotic medicine.   °· Genital herpes, hepatitis, and HIV can be treated, but not cured, with prescribed medicines. The medicines lessen symptoms.   °· Genital warts from HPV can be treated with medicine or by freezing, burning (electrocautery), or surgery. Warts may come back.   °· HPV cannot be cured with medicine or surgery. However, abnormal areas may be removed from the cervix, vagina, or vulva.   °· If your diagnosis is confirmed, your recent sexual partners need treatment. This is true even if they are symptom-free or have a negative culture or evaluation. They should not have sex until their health care providers say it is okay. °HOW CAN I REDUCE MY RISK OF GETTING AN STD? °Take these steps to reduce your risk of getting an STD: °· Use latex condoms, dental dams, and water-soluble lubricants during sexual activity. Do not use petroleum jelly or oils. °· Avoid having multiple sex partners. °· Do not have sex with someone who has other sex partners. °· Do not have sex with anyone you do not know or who is at   high risk for an STD.  Avoid risky sex practices that can break your skin.  Do not have sex if you have open sores on your mouth or skin.  Avoid drinking too much alcohol or taking illegal drugs. Alcohol and drugs can affect your judgment and put you in a vulnerable position.  Avoid engaging in oral and anal sex acts.  Get vaccinated for HPV and hepatitis. If you have not received these vaccines in the past, talk to your health care provider about whether one  or both might be right for you.   If you are at risk of being infected with HIV, it is recommended that you take a prescription medicine daily to prevent HIV infection. This is called pre-exposure prophylaxis (PrEP). You are considered at risk if:  You are a man who has sex with other men (MSM).  You are a heterosexual man or woman and are sexually active with more than one partner.  You take drugs by injection.  You are sexually active with a partner who has HIV.  Talk with your health care provider about whether you are at high risk of being infected with HIV. If you choose to begin PrEP, you should first be tested for HIV. You should then be tested every 3 months for as long as you are taking PrEP.  WHAT SHOULD I DO IF I THINK I HAVE AN STD?  See your health care provider.   Tell your sexual partner(s). They should be tested and treated for any STDs.  Do not have sex until your health care provider says it is okay. WHEN SHOULD I GET IMMEDIATE MEDICAL CARE? Contact your health care provider right away if:   You have severe abdominal pain.  You are a man and notice swelling or pain in your testicles.  You are a woman and notice swelling or pain in your vagina. Document Released: 08/22/2002 Document Revised: 06/06/2013 Document Reviewed: 12/20/2012 [facility] Patient Information 2015 [place], [place]. This information is not intended to replace advice given to you by your health care provider. Make sure you discuss any questions you have with your health care provider. Bacterial Vaginosis Bacterial vaginosis is a vaginal infection that occurs when the normal balance of bacteria in the vagina is disrupted. It results from an overgrowth of certain bacteria. This is the most common vaginal infection in women of childbearing age. Treatment is important to prevent complications, especially in pregnant women, as it can cause a premature delivery. CAUSES  Bacterial vaginosis is caused by  an increase in harmful bacteria that are normally present in smaller amounts in the vagina. Several different kinds of bacteria can cause bacterial vaginosis. However, the reason that the condition develops is not fully understood. RISK FACTORS Certain activities or behaviors can put you at an increased risk of developing bacterial vaginosis, including:  Having a new sex partner or multiple sex partners.  Douching.  Using an intrauterine device (IUD) for contraception. Women do not get bacterial vaginosis from toilet seats, bedding, swimming pools, or contact with objects around them. SIGNS AND SYMPTOMS  Some women with bacterial vaginosis have no signs or symptoms. Common symptoms include:  Grey vaginal discharge.  A fishlike odor with discharge, especially after sexual intercourse.  Itching or burning of the vagina and vulva.  Burning or pain with urination. DIAGNOSIS  Your health care provider will take a medical history and examine the vagina for signs of bacterial vaginosis. A sample of vaginal fluid may be taken. Your  health care provider will look at this sample under a microscope to check for bacteria and abnormal cells. A vaginal pH test may also be done.  °TREATMENT  °Bacterial vaginosis may be treated with antibiotic medicines. These may be given in the form of a pill or a vaginal cream. A second round of antibiotics may be prescribed if the condition comes back after treatment.  °HOME CARE INSTRUCTIONS  °· Only take over-the-counter or prescription medicines as directed by your health care provider. °· If antibiotic medicine was prescribed, take it as directed. Make sure you finish it even if you start to feel better. °· Do not have sex until treatment is completed. °· Tell all sexual partners that you have a vaginal infection. They should see their health care provider and be treated if they have problems, such as a mild rash or itching. °· Practice safe sex by using condoms and  only having one sex partner. °SEEK MEDICAL CARE IF:  °· Your symptoms are not improving after 3 days of treatment. °· You have increased discharge or pain. °· You have a fever. °MAKE SURE YOU:  °· Understand these instructions. °· Will watch your condition. °· Will get help right away if you are not doing well or get worse. °FOR MORE INFORMATION  °Centers for Disease Control and Prevention, Division of STD Prevention: www.cdc.gov/std °American Sexual Health Association (ASHA): www.ashastd.org  °Document Released: 06/01/2005 Document Revised: 03/22/2013 Document Reviewed: 01/11/2013 °ExitCare® Patient Information ©2015 [employer]. This information is not intended to replace advice given to you by your health care provider. Make sure you discuss any questions you have with your health care provider. ° °

## 2014-06-04 NOTE — ED Provider Notes (Signed)
CSN: [id number]     Arrival date & time 06/04/14  1903 History   First MD Initiated Contact with Patient 06/04/14 1924     Chief Complaint  Patient presents with  . SEXUALLY TRANSMITTED DISEASE     (Consider location/radiation/quality/duration/timing/severity/associated sxs/prior Treatment) HPI Comments: Patient presents emergency department with chief complaint of recurrent vaginal discharge. She states that she gets frequent used infections. She also reports having unprotected sex with her boyfriend. She denies any abdominal pain, dysuria, nausea or vomiting, or fevers. She has not tried taking anything to alleviate her symptoms. There are no aggravating or alleviating factors.  The history is provided by the patient. No language interpreter was used.    History reviewed. No pertinent past medical history. Past Surgical History  Procedure Laterality Date  . No past surgeries     Family History  Problem Relation Age of Onset  . Hypertension    . Diabetes    . Cancer Paternal Grandmother    History  Substance Use Topics  . Smoking status: Never Smoker   . Smokeless tobacco: Never Used  . Alcohol Use: Yes     Comment: occ   OB History    Gravida Para Term Preterm AB TAB SAB Ectopic Multiple Living   0 0 0 0 0 0 0 0 0 0      Review of Systems  Constitutional: Negative for fever and chills.  Respiratory: Negative for shortness of breath.   Cardiovascular: Negative for chest pain.  Gastrointestinal: Negative for nausea, vomiting, diarrhea and constipation.  Genitourinary: Positive for vaginal discharge. Negative for dysuria.  All other systems reviewed and are negative.     Allergies  Review of patient's allergies indicates no known allergies.  Home Medications   Prior to Admission medications   Medication Sig Start Date End Date Taking? Authorizing Provider  cephALEXin (KEFLEX) 500 MG capsule Take 1 capsule (500 mg total) by mouth 4 (four) times daily. 04/24/14    Toy CookeyMegan Docherty, MD  fluconazole (DIFLUCAN) 150 MG tablet Take 1 tablet (150 mg total) by mouth once. 04/24/14   Toy CookeyMegan Docherty, MD  ibuprofen (ADVIL,MOTRIN) 800 MG tablet Take 1 tablet (800 mg total) by mouth every 8 (eight) hours as needed. 03/09/14   Brock Badharles A Harper, MD  medroxyPROGESTERone (DEPO-PROVERA) 150 MG/ML injection Inject 1 mL (150 mg total) into the muscle once. 02/05/14   Antionette CharLisa Jackson-Moore, MD   BP 121/69 mmHg  Pulse 92  Temp(Src) 98.9 F (37.2 C) (Oral)  Resp 16  SpO2 100% Physical Exam  Constitutional: She is oriented to person, place, and time. She appears well-developed and well-nourished.  HENT:  Head: Normocephalic and atraumatic.  Eyes: Conjunctivae and EOM are normal. Pupils are equal, round, and reactive to light.  Neck: Normal range of motion. Neck supple.  Cardiovascular: Normal rate and regular rhythm.  Exam reveals no gallop and no friction rub.   No murmur heard. Pulmonary/Chest: Effort normal and breath sounds normal. No respiratory distress. She has no wheezes. She has no rales. She exhibits no tenderness.  Abdominal: Soft. She exhibits no distension and no mass. There is no tenderness. There is no rebound and no guarding.  Genitourinary:  Pelvic exam chaperoned by female ER tech, no right or left adnexal tenderness, no uterine tenderness, thick white vaginal discharge, no bleeding, no CMT or friability, no foreign body, no injury to the external genitalia, no other significant findings   Musculoskeletal: Normal range of motion. She exhibits no edema or tenderness.  Neurological:  She is alert and oriented to person, place, and time.  Skin: Skin is warm and dry.  Psychiatric: She has a normal mood and affect. Her behavior is normal. Judgment and thought content normal.  Nursing note and vitals reviewed.   ED Course  Procedures (including critical care time) Results for orders placed or performed during the hospital encounter of 06/04/14  Wet prep,  genital  Result Value Ref Range   Yeast Wet Prep HPF POC NONE SEEN NONE SEEN   Trich, Wet Prep NONE SEEN NONE SEEN   Clue Cells Wet Prep HPF POC FEW (A) NONE SEEN   WBC, Wet Prep HPF POC RARE (A) NONE SEEN  Urinalysis, Routine w reflex microscopic  Result Value Ref Range   Color, Urine YELLOW YELLOW   APPearance CLEAR CLEAR   Specific Gravity, Urine 1.027 1.005 - 1.030   pH 8.0 5.0 - 8.0   Glucose, UA NEGATIVE NEGATIVE mg/dL   Hgb urine dipstick MODERATE (A) NEGATIVE   Bilirubin Urine NEGATIVE NEGATIVE   Ketones, ur NEGATIVE NEGATIVE mg/dL   Protein, ur NEGATIVE NEGATIVE mg/dL   Urobilinogen, UA 0.2 0.0 - 1.0 mg/dL   Nitrite NEGATIVE NEGATIVE   Leukocytes, UA MODERATE (A) NEGATIVE  Urine microscopic-add on  Result Value Ref Range   Squamous Epithelial / LPF FEW (A) RARE   WBC, UA 11-20 <3 WBC/hpf   RBC / HPF 3-6 <3 RBC/hpf   Bacteria, UA RARE RARE  [facility] urine preg, [facility])  Result Value Ref Range   Preg Test, Ur NEGATIVE NEGATIVE   No results found.   Imaging Review No results found.   EKG Interpretation None      MDM   Final diagnoses:  Vaginal discharge    Patient with vaginal discharge.  Will check pelvic and reassess.  Wet prep remarkable for a few clue cells, however no fishy odor. Patient does have discharge, and does report having unprotected sex. GC tests pending, but will treat with Rocephin and azithromycin. Discharge to home.    Roxy HorsemanRobert Naira Standiford, PA-C 06/04/14 2037  Arby BarretteMarcy Pfeiffer, MD 06/04/14 [phone]

## 2014-06-04 NOTE — ED Notes (Signed)
Pt reports she recently had unprotected sex with boyfriend. Reports white vaginal clumps x1.5 weeks. Denies pain. Denies odor.

## 2014-06-06 LAB — URINE CULTURE
CULTURE: NO GROWTH
Colony Count: NO GROWTH

## 2014-06-11 ENCOUNTER — Encounter: Payer: Self-pay | Admitting: *Deleted

## 2014-06-12 ENCOUNTER — Encounter: Payer: Self-pay | Admitting: Obstetrics & Gynecology

## 2014-06-16 ENCOUNTER — Other Ambulatory Visit: Payer: Self-pay | Admitting: Obstetrics

## 2014-06-19 LAB — GC/CHLAMYDIA PROBE AMP
CT Probe RNA: NEGATIVE
GC PROBE AMP APTIMA: NEGATIVE

## 2014-07-12 ENCOUNTER — Ambulatory Visit: Payer: Medicaid Other | Admitting: Obstetrics

## 2014-08-03 ENCOUNTER — Encounter (HOSPITAL_COMMUNITY): Payer: Self-pay | Admitting: Emergency Medicine

## 2014-08-03 ENCOUNTER — Emergency Department (HOSPITAL_COMMUNITY)
Admission: EM | Admit: 2014-08-03 | Discharge: 2014-08-04 | Disposition: A | Discharge status: Home / Self Care | Payer: Managed Care, Other (non HMO) | Attending: Emergency Medicine | Admitting: Emergency Medicine

## 2014-08-03 ENCOUNTER — Other Ambulatory Visit: Payer: Self-pay | Admitting: Obstetrics

## 2014-08-03 DIAGNOSIS — Z792 Long term (current) use of antibiotics: Secondary | ICD-10-CM | POA: Diagnosis not present

## 2014-08-03 DIAGNOSIS — Z3202 Encounter for pregnancy test, result negative: Secondary | ICD-10-CM | POA: Insufficient documentation

## 2014-08-03 DIAGNOSIS — N72 Inflammatory disease of cervix uteri: Secondary | ICD-10-CM

## 2014-08-03 DIAGNOSIS — N898 Other specified noninflammatory disorders of vagina: Secondary | ICD-10-CM | POA: Diagnosis present

## 2014-08-03 LAB — URINE MICROSCOPIC-ADD ON

## 2014-08-03 LAB — URINALYSIS, ROUTINE W REFLEX MICROSCOPIC
BILIRUBIN URINE: NEGATIVE
GLUCOSE, UA: NEGATIVE mg/dL
KETONES UR: NEGATIVE mg/dL
Nitrite: NEGATIVE
Protein, ur: NEGATIVE mg/dL
Specific Gravity, Urine: 1.021 (ref 1.005–1.030)
Urobilinogen, UA: 0.2 mg/dL (ref 0.0–1.0)
pH: 7 (ref 5.0–8.0)

## 2014-08-03 LAB — POC URINE PREG, ED: PREG TEST UR: NEGATIVE

## 2014-08-03 LAB — WET PREP, GENITAL
Clue Cells Wet Prep HPF POC: NONE SEEN
Trich, Wet Prep: NONE SEEN

## 2014-08-03 MED ORDER — AZITHROMYCIN 1 G PO PACK
1.0000 g | PACK | Freq: Once | ORAL | Status: AC
  Administered 2014-08-03: 1 g via ORAL
  Filled 2014-08-03: qty 1

## 2014-08-03 MED ORDER — LIDOCAINE HCL 1 % IJ SOLN
20.0000 mL | Freq: Once | INTRAMUSCULAR | Status: AC
  Administered 2014-08-03: 1 mL

## 2014-08-03 MED ORDER — CEFTRIAXONE SODIUM 250 MG IJ SOLR
250.0000 mg | Freq: Once | INTRAMUSCULAR | Status: AC
  Administered 2014-08-03: 250 mg via INTRAMUSCULAR
  Filled 2014-08-03: qty 250

## 2014-08-03 MED ORDER — FLUCONAZOLE 150 MG PO TABS
150.0000 mg | ORAL_TABLET | Freq: Once | ORAL | Status: AC
  Administered 2014-08-03: 150 mg via ORAL
  Filled 2014-08-03: qty 1

## 2014-08-03 MED ORDER — LIDOCAINE HCL 1 % IJ SOLN
INTRAMUSCULAR | Status: AC
  Administered 2014-08-03: 1 mL
  Filled 2014-08-03: qty 20

## 2014-08-03 NOTE — ED Notes (Addendum)
Pt reports having vaginal discharge for about week along with bladder spasms for a yeear. Pt states she stopped  birth control shot last November due to symptoms but has continued to have vaginal and bladder complications. Pt denies any blood in urine.

## 2014-08-03 NOTE — Discharge Instructions (Signed)
Cervicitis Use a condom each time that you have sex. Call Dr. Tamela OddiJackson Moore on Monday, 08/06/14 to arrange to be seen at the next available office appointment. It is okay to take Tylenol or Advil as needed for pain Cervicitis is a soreness and puffiness (inflammation) of the cervix.  HOME CARE  Do not have sex (intercourse) until your doctor says it is okay.  Do not have sex until your partner is treated or as told by your doctor.  Take your antibiotic medicine as told. Finish it even if you start to feel better. GET HELP IF:   Your symptoms that brought you to the doctor come back.  You have a fever. MAKE SURE YOU:   Understand these instructions.  Will watch your condition.  Will get help right away if you are not doing well or get worse. Document Released: 03/10/2008 Document Revised: 06/06/2013 Document Reviewed: 11/23/2012 [facility] Patient Information 2015 [place]ExitCare, [place]LL[place]. This information is not intended to replace advice given to you by your health care provider. Make sure you discuss any questions you have with your health care provider.

## 2014-08-03 NOTE — ED Provider Notes (Signed)
CSN: [id number]     Arrival date & time 08/03/14  1935 History   First MD Initiated Contact with Patient 08/03/14 2148     Chief Complaint  Patient presents with  . Vaginal Discharge  . Bladder spasms      (Consider location/radiation/quality/duration/timing/severity/associated sxs/prior Treatment) HPI ComPlains of vaginal discharge for one week. Also presents with urinary frequency and dysuria for one year. Patient reports she's had frequent urinary tract infections and yeast infections. She reports that Dr. Tamela OddiJackson Moore told her that it was secondary to her depo shots. She's recently stopped that the shots last menstrual period was 2 weeks ago, lasting 2 days. Presently asymptomatic. No fever. No other associated symptoms. No pain presently. History reviewed. No pertinent past medical history. Past Surgical History  Procedure Laterality Date  . No past surgeries     Family History  Problem Relation Age of Onset  . Hypertension    . Diabetes    . Cancer Paternal Grandmother    History  Substance Use Topics  . Smoking status: Never Smoker   . Smokeless tobacco: Never Used  . Alcohol Use: Yes     Comment: occ   OB History    Gravida Para Term Preterm AB TAB SAB Ectopic Multiple Living   0 0 0 0 0 0 0 0 0 0      Review of Systems  Constitutional: Negative.   HENT: Negative.   Respiratory: Negative.   Cardiovascular: Negative.   Gastrointestinal: Negative.   Genitourinary: Positive for dysuria, frequency and vaginal discharge.  Musculoskeletal: Negative.   Skin: Negative.   Neurological: Negative.   Psychiatric/Behavioral: Negative.   All other systems reviewed and are negative.     Allergies  Review of patient's allergies indicates no known allergies.  Home Medications   Prior to Admission medications   Medication Sig Start Date End Date Taking? Authorizing Provider  cephALEXin (KEFLEX) 500 MG capsule Take 1 capsule (500 mg total) by mouth 4 (four) times  daily. 04/24/14   Toy CookeyMegan Docherty, MD  fluconazole (DIFLUCAN) 150 MG tablet Take 1 tablet (150 mg total) by mouth once. 06/04/14   Roxy Horsemanobert Browning, PA-C  ibuprofen (ADVIL,MOTRIN) 800 MG tablet TAKE 1 TABLET (800 MG TOTAL) BY MOUTH EVERY 8 (EIGHT) HOURS AS NEEDED. 06/17/14   Brock Badharles A Harper, MD  medroxyPROGESTERone (DEPO-PROVERA) 150 MG/ML injection Inject 1 mL (150 mg total) into the muscle once. 02/05/14   Antionette CharLisa Jackson-Moore, MD  metroNIDAZOLE (FLAGYL) 500 MG tablet Take 1 tablet (500 mg total) by mouth 2 (two) times daily. 06/04/14   Roxy Horsemanobert Browning, PA-C   BP 127/75 mmHg  Pulse 84  Temp(Src) 98.4 F (36.9 C) (Oral)  Resp 15  SpO2 100%  LMP 08/03/2014 Physical Exam  Constitutional: She appears well-developed and well-nourished.  HENT:  Head: Normocephalic and atraumatic.  Eyes: Conjunctivae are normal. Pupils are equal, round, and reactive to light.  Neck: Neck supple. No tracheal deviation present. No thyromegaly present.  Cardiovascular: Normal rate and regular rhythm.   No murmur heard. Pulmonary/Chest: Effort normal and breath sounds normal.  Abdominal: Soft. Bowel sounds are normal. She exhibits no distension. There is no tenderness.  Genitourinary:  No external lesion. Copious cottage cheese -like discharge. Cervical os closed Positive cervical motion tenderness. No adnexal masses or tenderness  Musculoskeletal: Normal range of motion. She exhibits no edema or tenderness.  Neurological: She is alert. Coordination normal.  Skin: Skin is warm and dry. No rash noted.  Psychiatric: She has a normal mood  and affect.  Nursing note and vitals reviewed.   ED Course  Procedures (including critical care time) Labs Review Labs Reviewed  URINALYSIS, ROUTINE W REFLEX MICROSCOPIC - Abnormal; Notable for the following:    APPearance CLOUDY (*)    Hgb urine dipstick TRACE (*)    Leukocytes, UA LARGE (*)    All other components within normal limits  URINE MICROSCOPIC-ADD ON - Abnormal;  Notable for the following:    Squamous Epithelial / LPF FEW (*)    Bacteria, UA MANY (*)    All other components within normal limits  POC URINE PREG, ED    Imaging Review No results found.   EKG Interpretation None     11:45 PM patient states "I feel good" she is resting comfortably after treatment with Zithromax, Diflucan and Rocephin. Results for orders placed or performed during the hospital encounter of 08/03/14  Wet prep, genital  Result Value Ref Range   Yeast Wet Prep HPF POC MANY (A) NONE SEEN   Trich, Wet Prep NONE SEEN NONE SEEN   Clue Cells Wet Prep HPF POC NONE SEEN NONE SEEN   WBC, Wet Prep HPF POC MANY (A) NONE SEEN  U/A (may I&O cath if menses)  Result Value Ref Range   Color, Urine YELLOW YELLOW   APPearance CLOUDY (A) CLEAR   Specific Gravity, Urine 1.021 1.005 - 1.030   pH 7.0 5.0 - 8.0   Glucose, UA NEGATIVE NEGATIVE mg/dL   Hgb urine dipstick TRACE (A) NEGATIVE   Bilirubin Urine NEGATIVE NEGATIVE   Ketones, ur NEGATIVE NEGATIVE mg/dL   Protein, ur NEGATIVE NEGATIVE mg/dL   Urobilinogen, UA 0.2 0.0 - 1.0 mg/dL   Nitrite NEGATIVE NEGATIVE   Leukocytes, UA LARGE (A) NEGATIVE  Urine microscopic-add on  Result Value Ref Range   Squamous Epithelial / LPF FEW (A) RARE   WBC, UA 3-6 <3 WBC/hpf   RBC / HPF 0-2 <3 RBC/hpf   Bacteria, UA MANY (A) RARE   Urine-Other RARE YEAST   POC Urine Pregnancy, ED (if pre-menopausal female) - do NOT order at [facility]  Result Value Ref Range   Preg Test, Ur NEGATIVE NEGATIVE   No results found.  MDM  Clinically patient has cervicitis and yeast vaginitis. Plan states sex encouraged. Follow-up with Dr. Tamela Oddi Diagnosis #1 cervicitis #2 vaginitis Final diagnoses:  None        Doug Sou, MD 08/03/14 1610

## 2014-08-05 LAB — RPR: RPR: NONREACTIVE

## 2014-08-06 LAB — GC/CHLAMYDIA PROBE AMP (~~LOC~~) NOT AT ARMC
Chlamydia: NEGATIVE
Neisseria Gonorrhea: NEGATIVE

## 2014-08-06 LAB — HIV ANTIBODY (ROUTINE TESTING W REFLEX): HIV Screen 4th Generation wRfx: NONREACTIVE

## 2014-08-27 ENCOUNTER — Encounter (HOSPITAL_COMMUNITY): Payer: Self-pay | Admitting: Emergency Medicine

## 2014-08-27 ENCOUNTER — Emergency Department (HOSPITAL_COMMUNITY)
Admission: EM | Admit: 2014-08-27 | Discharge: 2014-08-27 | Disposition: A | Discharge status: Home / Self Care | Payer: Managed Care, Other (non HMO) | Attending: Emergency Medicine | Admitting: Emergency Medicine

## 2014-08-27 DIAGNOSIS — Z79899 Other long term (current) drug therapy: Secondary | ICD-10-CM | POA: Insufficient documentation

## 2014-08-27 DIAGNOSIS — R21 Rash and other nonspecific skin eruption: Secondary | ICD-10-CM | POA: Diagnosis present

## 2014-08-27 DIAGNOSIS — Z792 Long term (current) use of antibiotics: Secondary | ICD-10-CM | POA: Diagnosis not present

## 2014-08-27 MED ORDER — IVAREST MEDICATED POISON IVY 1 % EX LIQD: CUTANEOUS | Status: DC

## 2014-08-27 MED ORDER — DIPHENHYDRAMINE HCL 25 MG PO TABS: 25.0000 mg | ORAL_TABLET | Freq: Four times a day (QID) | ORAL | Status: DC

## 2014-08-27 NOTE — ED Notes (Signed)
Per pt, states she was in woods on Saturday collecting sticks for bon fire-noticed rash on left forearm and face yesterday
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
## 2014-08-27 NOTE — Discharge Instructions (Signed)
Contact Dermatitis °Contact dermatitis is a reaction to certain substances that touch the skin. Contact dermatitis can be either irritant contact dermatitis or allergic contact dermatitis. Irritant contact dermatitis does not require previous exposure to the substance for a reaction to occur. Allergic contact dermatitis only occurs if you have been exposed to the substance before. Upon a repeat exposure, your body reacts to the substance.  °CAUSES  °Many substances can cause contact dermatitis. Irritant dermatitis is most commonly caused by repeated exposure to mildly irritating substances, such as: °· Makeup. °· Soaps. °· Detergents. °· Bleaches. °· Acids. °· Metal salts, such as nickel. °Allergic contact dermatitis is most commonly caused by exposure to: °· Poisonous plants. °· Chemicals (deodorants, shampoos). °· Jewelry. °· Latex. °· Neomycin in triple antibiotic cream. °· Preservatives in products, including clothing. °SYMPTOMS  °The area of skin that is exposed may develop: °· Dryness or flaking. °· Redness. °· Cracks. °· Itching. °· Pain or a burning sensation. °· Blisters. °With allergic contact dermatitis, there may also be swelling in areas such as the eyelids, mouth, or genitals.  °DIAGNOSIS  °Your caregiver can usually tell what the problem is by doing a physical exam. In cases where the cause is uncertain and an allergic contact dermatitis is suspected, a patch skin test may be performed to help determine the cause of your dermatitis. °TREATMENT °Treatment includes protecting the skin from further contact with the irritating substance by avoiding that substance if possible. Barrier creams, powders, and gloves may be helpful. Your caregiver may also recommend: °· Steroid creams or ointments applied 2 times daily. For best results, soak the rash area in cool water for 20 minutes. Then apply the medicine. Cover the area with a plastic wrap. You can store the steroid cream in the refrigerator for a "chilly"  effect on your rash. That may decrease itching. Oral steroid medicines may be needed in more severe cases. °· Antibiotics or antibacterial ointments if a skin infection is present. °· Antihistamine lotion or an antihistamine taken by mouth to ease itching. °· Lubricants to keep moisture in your skin. °· Burow's solution to reduce redness and soreness or to dry a weeping rash. Mix one packet or tablet of solution in 2 cups cool water. Dip a clean washcloth in the mixture, wring it out a bit, and put it on the affected area. Leave the cloth in place for 30 minutes. Do this as often as possible throughout the day. °· Taking several cornstarch or baking soda baths daily if the area is too large to cover with a washcloth. °Harsh chemicals, such as alkalis or acids, can cause skin damage that is like a burn. You should flush your skin for 15 to 20 minutes with cold water after such an exposure. You should also seek immediate medical care after exposure. Bandages (dressings), antibiotics, and pain medicine may be needed for severely irritated skin.  °HOME CARE INSTRUCTIONS °· Avoid the substance that caused your reaction. °· Keep the area of skin that is affected away from hot water, soap, sunlight, chemicals, acidic substances, or anything else that would irritate your skin. °· Do not scratch the rash. Scratching may cause the rash to become infected. °· You may take cool baths to help stop the itching. °· Only take over-the-counter or prescription medicines as directed by your caregiver. °· See your caregiver for follow-up care as directed to make sure your skin is healing properly. °SEEK MEDICAL CARE IF:  °· Your condition is not better after 3   days of treatment. °· You seem to be getting worse. °· You see signs of infection such as swelling, tenderness, redness, soreness, or warmth in the affected area. °· You have any problems related to your medicines. °Document Released: 05/29/2000 Document Revised: 08/24/2011  Document Reviewed: 11/04/2010 °[employer]® Patient Information ©2015 [employer]. This information is not intended to replace advice given to you by your health care provider. Make sure you discuss any questions you have with your health care provider. ° °Rash °A rash is a change in the color or texture of your skin. There are many different types of rashes. You may have other problems that accompany your rash. °CAUSES  °· Infections. °· Allergic reactions. This can include allergies to pets or foods. °· Certain medicines. °· Exposure to certain chemicals, soaps, or cosmetics. °· Heat. °· Exposure to poisonous plants. °· Tumors, both cancerous and noncancerous. °SYMPTOMS  °· Redness. °· Scaly skin. °· Itchy skin. °· Dry or cracked skin. °· Bumps. °· Blisters. °· Pain. °DIAGNOSIS  °Your caregiver may do a physical exam to determine what type of rash you have. A skin sample (biopsy) may be taken and examined under a microscope. °TREATMENT  °Treatment depends on the type of rash you have. Your caregiver may prescribe certain medicines. For serious conditions, you may need to see a skin doctor (dermatologist). °HOME CARE INSTRUCTIONS  °· Avoid the substance that caused your rash. °· Do not scratch your rash. This can cause infection. °· You may take cool baths to help stop itching. °· Only take over-the-counter or prescription medicines as directed by your caregiver. °· Keep all follow-up appointments as directed by your caregiver. °SEEK IMMEDIATE MEDICAL CARE IF: °· You have increasing pain, swelling, or redness. °· You have a fever. °· You have new or severe symptoms. °· You have body aches, diarrhea, or vomiting. °· Your rash is not better after 3 days. °MAKE SURE YOU: °· Understand these instructions. °· Will watch your condition. °· Will get help right away if you are not doing well or get worse. °Document Released: 05/22/2002 Document Revised: 08/24/2011 Document Reviewed: 03/16/2011 °ExitCare® Patient Information  ©2015 [employer]. This information is not intended to replace advice given to you by your health care provider. Make sure you discuss any questions you have with your health care provider. ° °

## 2014-08-28 ENCOUNTER — Encounter (HOSPITAL_COMMUNITY): Payer: Self-pay

## 2014-08-28 ENCOUNTER — Emergency Department (HOSPITAL_COMMUNITY)
Admission: EM | Admit: 2014-08-28 | Discharge: 2014-08-28 | Disposition: A | Discharge status: Home / Self Care | Payer: Managed Care, Other (non HMO) | Attending: Emergency Medicine | Admitting: Emergency Medicine

## 2014-08-28 DIAGNOSIS — Z792 Long term (current) use of antibiotics: Secondary | ICD-10-CM | POA: Insufficient documentation

## 2014-08-28 DIAGNOSIS — L255 Unspecified contact dermatitis due to plants, except food: Secondary | ICD-10-CM | POA: Diagnosis not present

## 2014-08-28 DIAGNOSIS — L237 Allergic contact dermatitis due to plants, except food: Secondary | ICD-10-CM

## 2014-08-28 DIAGNOSIS — R21 Rash and other nonspecific skin eruption: Secondary | ICD-10-CM | POA: Diagnosis present

## 2014-08-28 MED ORDER — PREDNISONE 20 MG PO TABS
60.0000 mg | ORAL_TABLET | Freq: Once | ORAL | Status: AC
  Administered 2014-08-28: 60 mg via ORAL
  Filled 2014-08-28: qty 3

## 2014-08-28 MED ORDER — PREDNISONE 50 MG PO TABS: 50.0000 mg | ORAL_TABLET | Freq: Every day | ORAL | Status: AC

## 2014-08-28 NOTE — ED Notes (Signed)
No e-signature obtained due to signature pad malfunction.

## 2014-08-28 NOTE — ED Notes (Signed)
Patient was seen in ED for rash yesterday, diagnosed with poison ivy.  She reports rash is worsening today.  No respiratory issues/wheezing noted.  Took Benadryl 25mg  at 1500 and 1900.
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
## 2014-10-30 ENCOUNTER — Ambulatory Visit: Payer: Medicaid Other | Admitting: Certified Nurse Midwife

## 2014-11-01 ENCOUNTER — Ambulatory Visit: Payer: Medicaid Other | Admitting: Certified Nurse Midwife

## 2014-11-02 ENCOUNTER — Ambulatory Visit (INDEPENDENT_AMBULATORY_CARE_PROVIDER_SITE_OTHER): Payer: Medicaid Other | Admitting: Certified Nurse Midwife

## 2014-11-02 ENCOUNTER — Ambulatory Visit: Payer: Medicaid Other | Admitting: Certified Nurse Midwife

## 2014-11-02 ENCOUNTER — Encounter: Payer: Self-pay | Admitting: Certified Nurse Midwife

## 2014-11-02 VITALS — BP 127/82 | HR 73 | Temp 97.1°F | Ht 60.0 in | Wt 134.0 lb

## 2014-11-02 DIAGNOSIS — Z3169 Encounter for other general counseling and advice on procreation: Secondary | ICD-10-CM

## 2014-11-02 DIAGNOSIS — N939 Abnormal uterine and vaginal bleeding, unspecified: Secondary | ICD-10-CM | POA: Diagnosis not present

## 2014-11-02 LAB — POCT URINALYSIS DIPSTICK
BILIRUBIN UA: NEGATIVE
GLUCOSE UA: NEGATIVE
Ketones, UA: NEGATIVE
Leukocytes, UA: NEGATIVE
Nitrite, UA: NEGATIVE
Protein, UA: NEGATIVE
RBC UA: 50
SPEC GRAV UA: 1.01
Urobilinogen, UA: NEGATIVE
pH, UA: 5

## 2014-11-02 LAB — POCT URINE PREGNANCY: PREG TEST UR: NEGATIVE

## 2014-11-02 MED ORDER — IBUPROFEN 800 MG PO TABS: ORAL_TABLET | ORAL | Status: DC

## 2014-11-02 MED ORDER — CITRANATAL HARMONY 30-1-260 MG PO CAPS: 1.0000 | ORAL_CAPSULE | Freq: Every day | ORAL | Status: DC

## 2014-11-02 NOTE — Addendum Note (Signed)
Addended by: Samantha CrimesENNEY, Blakley Michna ANNE on: 11/02/2014 02:35 PM   Modules accepted: Orders

## 2014-11-02 NOTE — Progress Notes (Signed)
Patient ID: Kerry Jimenez, female   DOB: 01-Oct-1995, 19 y.o.   MRN: [id number]   Chief Complaint  Patient presents with  . Vaginitis  . Weight Gain    HPI Kerry Jimenez is a 19 y.o. female.  C/O frequent vaginal discharge.  Declines birth control.  Had been on Depo Injections.  Currently sexually active with regular partner.  Desires to become pregnant.  Is having regular periods.   HPI  History reviewed. No pertinent past medical history.  Past Surgical History  Procedure Laterality Date  . No past surgeries      Family History  Problem Relation Age of Onset  . Hypertension    . Diabetes    . Cancer Paternal Grandmother     Social History History  Substance Use Topics  . Smoking status: Never Smoker   . Smokeless tobacco: Never Used  . Alcohol Use: 0.0 oz/week    0 Standard drinks or equivalent per week     Comment: occ    No Known Allergies  Current Outpatient Prescriptions  Medication Sig Dispense Refill  . ibuprofen (ADVIL,MOTRIN) 800 MG tablet TAKE 1 TABLET (800 MG TOTAL) BY MOUTH EVERY 8 (EIGHT) HOURS AS NEEDED. 60 tablet 1  . Prenat w/o A-FeCbn-DSS-FA-DHA (CITRANATAL HARMONY) 30-1-260 MG CAPS Take 1 tablet by mouth daily. 30 capsule 12   No current facility-administered medications for this visit.    Review of Systems Review of Systems Constitutional: negative for fatigue and weight loss Respiratory: negative for cough and wheezing Cardiovascular: negative for chest pain, fatigue and palpitations Gastrointestinal: negative for abdominal pain and change in bowel habits Genitourinary:negative Integument/breast: negative for nipple discharge Musculoskeletal:negative for myalgias Neurological: negative for gait problems and tremors Behavioral/Psych: negative for abusive relationship, depression Endocrine: negative for temperature intolerance     Blood pressure 127/82, pulse 73, temperature 97.1 F (36.2 C), height 5' (1.524 m), weight 60.782 kg (134  lb), last menstrual period 10/31/2014.  Physical Exam Physical Exam General:   alert  Skin:   no rash or abnormalities  Lungs:   clear to auscultation bilaterally  Heart:   regular rate and rhythm, S1, S2 normal, no murmur, click, rub or gallop  Breasts:   deferred  Abdomen:  normal findings: no organomegaly, soft, non-tender and no hernia  Pelvis:  External genitalia: normal general appearance Urinary system: urethral meatus normal and bladder without fullness, nontender Vaginal: normal without tenderness, induration or masses Cervix: no CMT Adnexa: normal bimanual exam Uterus: anteverted and non-tender, normal size    50% of 15 min visit spent on counseling and coordination of care.   Data Reviewed Previous medical hx, labs, meds  Assessment     Preconception counseling     Plan    Orders Placed This Encounter  Procedures  . SureSwab, Vaginosis/Vaginitis Plus  . POCT urinalysis dipstick  . POCT urine pregnancy   Meds ordered this encounter  Medications  . Prenat w/o A-FeCbn-DSS-FA-DHA (CITRANATAL HARMONY) 30-1-260 MG CAPS    Sig: Take 1 tablet by mouth daily.    Dispense:  30 capsule    Refill:  12   Follow up as needed or in 1 year.

## 2014-11-03 ENCOUNTER — Other Ambulatory Visit: Payer: Self-pay | Admitting: Obstetrics

## 2014-11-07 ENCOUNTER — Other Ambulatory Visit: Payer: Self-pay | Admitting: Certified Nurse Midwife

## 2014-11-07 DIAGNOSIS — B3731 Acute candidiasis of vulva and vagina: Secondary | ICD-10-CM

## 2014-11-07 DIAGNOSIS — N760 Acute vaginitis: Secondary | ICD-10-CM

## 2014-11-07 DIAGNOSIS — B373 Candidiasis of vulva and vagina: Secondary | ICD-10-CM

## 2014-11-07 DIAGNOSIS — B9689 Other specified bacterial agents as the cause of diseases classified elsewhere: Secondary | ICD-10-CM

## 2014-11-07 LAB — SURESWAB, VAGINOSIS/VAGINITIS PLUS
Atopobium vaginae: 7.4 Log (cells/mL)
C. GLABRATA, DNA: NOT DETECTED
C. TROPICALIS, DNA: NOT DETECTED
C. albicans, DNA: DETECTED — AB
C. parapsilosis, DNA: NOT DETECTED
C. trachomatis RNA, TMA: NOT DETECTED
LACTOBACILLUS SPECIES: NOT DETECTED Log (cells/mL)
MEGASPHAERA SPECIES: NOT DETECTED Log (cells/mL)
N. gonorrhoeae RNA, TMA: NOT DETECTED
T. vaginalis RNA, QL TMA: NOT DETECTED

## 2014-11-07 MED ORDER — TINIDAZOLE 500 MG PO TABS: 2.0000 g | ORAL_TABLET | Freq: Every day | ORAL | Status: AC

## 2014-11-07 MED ORDER — FLUCONAZOLE 100 MG PO TABS: 100.0000 mg | ORAL_TABLET | Freq: Once | ORAL | Status: DC

## 2014-12-21 ENCOUNTER — Other Ambulatory Visit (INDEPENDENT_AMBULATORY_CARE_PROVIDER_SITE_OTHER): Payer: Medicaid Other

## 2014-12-21 VITALS — BP 131/88 | HR 73 | Temp 99.0°F | Ht 60.0 in | Wt 130.0 lb

## 2014-12-21 DIAGNOSIS — N926 Irregular menstruation, unspecified: Secondary | ICD-10-CM

## 2014-12-21 NOTE — Progress Notes (Signed)
Patient in office for confirmation of pregnancy. Patient states she had several positive home pregnancy test. Pregnancy Test in office is positive. Patient states this is a planned and wanted pregnancy. Patient encouraged to continue prenatal vitamins and to schedule her NOB appointment. Reviewed signs of miscarriage.   BP 131/88 mmHg  Pulse 73  Temp(Src) 99 F (37.2 C)  Ht 5' (1.524 m)  Wt 130 lb (58.968 kg)  BMI 25.39 kg/m2  LMP 11/14/2014 (Exact Date)

## 2015-01-12 ENCOUNTER — Inpatient Hospital Stay (HOSPITAL_COMMUNITY)
Admission: AD | Admit: 2015-01-12 | Discharge: 2015-01-12 | Disposition: A | Discharge status: Home / Self Care | Payer: Medicaid Other | Source: Ambulatory Visit | Attending: Obstetrics | Admitting: Obstetrics

## 2015-01-12 ENCOUNTER — Encounter (HOSPITAL_COMMUNITY): Payer: Self-pay | Admitting: *Deleted

## 2015-01-12 DIAGNOSIS — O23591 Infection of other part of genital tract in pregnancy, first trimester: Secondary | ICD-10-CM | POA: Diagnosis not present

## 2015-01-12 DIAGNOSIS — Z3A08 8 weeks gestation of pregnancy: Secondary | ICD-10-CM | POA: Insufficient documentation

## 2015-01-12 DIAGNOSIS — B373 Candidiasis of vulva and vagina: Secondary | ICD-10-CM | POA: Insufficient documentation

## 2015-01-12 DIAGNOSIS — O98811 Other maternal infectious and parasitic diseases complicating pregnancy, first trimester: Secondary | ICD-10-CM | POA: Diagnosis not present

## 2015-01-12 DIAGNOSIS — B3731 Acute candidiasis of vulva and vagina: Secondary | ICD-10-CM

## 2015-01-12 DIAGNOSIS — N898 Other specified noninflammatory disorders of vagina: Secondary | ICD-10-CM | POA: Diagnosis present

## 2015-01-12 LAB — URINALYSIS, ROUTINE W REFLEX MICROSCOPIC
Bilirubin Urine: NEGATIVE
GLUCOSE, UA: NEGATIVE mg/dL
Hgb urine dipstick: NEGATIVE
KETONES UR: NEGATIVE mg/dL
LEUKOCYTES UA: NEGATIVE
Nitrite: NEGATIVE
PH: 6 (ref 5.0–8.0)
PROTEIN: NEGATIVE mg/dL
Specific Gravity, Urine: >1.03 — ABNORMAL HIGH (ref 1.005–1.030)
Urobilinogen, UA: 0.2 mg/dL (ref 0.0–1.0)

## 2015-01-12 LAB — POCT PREGNANCY, URINE: Preg Test, Ur: POSITIVE — AB

## 2015-01-12 LAB — WET PREP, GENITAL
Clue Cells Wet Prep HPF POC: NONE SEEN
TRICH WET PREP: NONE SEEN
Yeast Wet Prep HPF POC: NONE SEEN

## 2015-01-12 MED ORDER — TERCONAZOLE 0.4 % VA CREA: 1.0000 | TOPICAL_CREAM | Freq: Every day | VAGINAL | Status: DC

## 2015-01-12 NOTE — Discharge Instructions (Signed)

## 2015-01-12 NOTE — MAU Note (Signed)
Pt reports vaginal discharge and frequent vaginal infections.  Pt reports discharge is cloudy white.  Pt reports minor brown spotting but no bleeding.  Pt reports abdominal cramping.

## 2015-01-12 NOTE — MAU Provider Note (Signed)
History     CSN: [id number]  Arrival date and time: 01/12/15 1448   First Provider Initiated Contact with Patient 01/12/15 1548      Chief Complaint  Patient presents with  . Abdominal Pain  . Vaginal Discharge   HPI  Ms. Kerry Jimenez is a 19 y.o. G1P0000 at [redacted]w[redacted]d who presents to MAU today with complaint of vaginal discharge. The patient states that she has a thin, cloudy, white discharge with occasional itching. She denies odor or irritation. She denies abdominal pain or bleeding today. She also denies N/V/D, fever or UTI symptoms. She has had intermittent constipation and associated cramping a few weeks ago. She also states scant brown spotting last week, but none since.   OB History    Gravida Para Term Preterm AB TAB SAB Ectopic Multiple Living   1 0 0 0 0 0 0 0 0 0       History reviewed. No pertinent past medical history.  Past Surgical History  Procedure Laterality Date  . No past surgeries      Family History  Problem Relation Age of Onset  . Hypertension    . Diabetes    . Cancer Paternal Grandmother     History  Substance Use Topics  . Smoking status: Never Smoker   . Smokeless tobacco: Never Used  . Alcohol Use: No    Allergies: No Known Allergies  Prescriptions prior to admission  Medication Sig Dispense Refill Last Dose  . Prenat w/o A-FeCbn-DSS-FA-DHA (CITRANATAL HARMONY) 30-1-260 MG CAPS Take 1 tablet by mouth daily. (Patient taking differently: Take 1 capsule by mouth daily. ) 30 capsule 12 01/11/2015 at Unknown time  . fluconazole (DIFLUCAN) 100 MG tablet Take 1 tablet (100 mg total) by mouth once. Repeat dose in 48-72 hours. (Patient not taking: Reported on 01/12/2015) 3 tablet 1   . ibuprofen (ADVIL,MOTRIN) 800 MG tablet TAKE 1 TABLET (800 MG TOTAL) BY MOUTH EVERY 8 (EIGHT) HOURS AS NEEDED. (Patient not taking: Reported on 01/12/2015) 60 tablet 1     Review of Systems  Constitutional: Negative for fever and malaise/fatigue.  Gastrointestinal:  Negative for nausea, vomiting, abdominal pain, diarrhea and constipation.  Genitourinary: Negative for dysuria, urgency and frequency.       + vaginal discharge Neg - vaginal bleeding   Physical Exam   Blood pressure 91/60, pulse 67, temperature 97.9 F (36.6 C), temperature source Oral, resp. rate 16, last menstrual period 11/14/2014.  Physical Exam  Nursing note and vitals reviewed. Constitutional: She is oriented to person, place, and time. She appears well-developed and well-nourished. No distress.  HENT:  Head: Normocephalic and atraumatic.  Cardiovascular: Normal rate.   Respiratory: Effort normal.  GI: Soft. She exhibits no distension and no mass. There is no tenderness. There is no rebound and no guarding.  Genitourinary: Uterus is not enlarged and not tender. Cervix exhibits no motion tenderness, no discharge and no friability. Right adnexum displays no mass and no tenderness. Left adnexum displays no mass and no tenderness. No bleeding in the vagina. Vaginal discharge (moderate amount of thick, white discharge noted) found.  Neurological: She is alert and oriented to person, place, and time.  Skin: Skin is warm and dry. No erythema.  Psychiatric: She has a normal mood and affect.   Results for orders placed or performed during the hospital encounter of 01/12/15 (from the past 24 hour(s))  Urinalysis, Routine w reflex microscopic (not at [facility])     Status: Abnormal   Collection Time: 01/12/15  3:27 PM  Result Value Ref Range   Color, Urine YELLOW YELLOW   APPearance CLEAR CLEAR   Specific Gravity, Urine >1.030 (H) 1.005 - 1.030   pH 6.0 5.0 - 8.0   Glucose, UA NEGATIVE NEGATIVE mg/dL   Hgb urine dipstick NEGATIVE NEGATIVE   Bilirubin Urine NEGATIVE NEGATIVE   Ketones, ur NEGATIVE NEGATIVE mg/dL   Protein, ur NEGATIVE NEGATIVE mg/dL   Urobilinogen, UA 0.2 0.0 - 1.0 mg/dL   Nitrite NEGATIVE NEGATIVE   Leukocytes, UA NEGATIVE NEGATIVE  Pregnancy, urine POC     Status:  Abnormal   Collection Time: 01/12/15  3:47 PM  Result Value Ref Range   Preg Test, Ur POSITIVE (A) NEGATIVE  Wet prep, genital     Status: Abnormal   Collection Time: 01/12/15  3:55 PM  Result Value Ref Range   Yeast Wet Prep HPF POC NONE SEEN NONE SEEN   Trich, Wet Prep NONE SEEN NONE SEEN   Clue Cells Wet Prep HPF POC NONE SEEN NONE SEEN   WBC, Wet Prep HPF POC FEW (A) NONE SEEN    MAU Course  Procedures None  MDM +UPT UA, wet prep and GC/chlamydia Declines HIV and RPR today  Assessment and Plan  A: Yeast vulvovaginitis  P: Discharge home Rx for Terazol 7 given to patient First trimester precautions discussed Patient advised to follow-up with Dr. Clearance Coots as scheduled for routine prenatal care Patient may return to MAU as needed or if her condition were to change or worsen   Marny Lowenstein, PA-C  01/12/2015, 4:36 PM

## 2015-01-14 LAB — GC/CHLAMYDIA PROBE AMP (~~LOC~~) NOT AT ARMC
CHLAMYDIA, DNA PROBE: NEGATIVE
NEISSERIA GONORRHEA: NEGATIVE

## 2015-01-25 ENCOUNTER — Ambulatory Visit: Payer: Medicaid Other | Admitting: Certified Nurse Midwife

## 2015-01-25 ENCOUNTER — Ambulatory Visit (HOSPITAL_COMMUNITY)
Admission: RE | Admit: 2015-01-25 | Discharge: 2015-01-25 | Disposition: A | Discharge status: Home / Self Care | Payer: Managed Care, Other (non HMO) | Source: Ambulatory Visit | Attending: Obstetrics | Admitting: Obstetrics

## 2015-01-25 ENCOUNTER — Other Ambulatory Visit: Payer: Self-pay | Admitting: Obstetrics

## 2015-01-25 ENCOUNTER — Encounter: Payer: Self-pay | Admitting: Obstetrics

## 2015-01-25 DIAGNOSIS — Z3687 Encounter for antenatal screening for uncertain dates: Secondary | ICD-10-CM

## 2015-01-25 DIAGNOSIS — O3680X1 Pregnancy with inconclusive fetal viability, fetus 1: Secondary | ICD-10-CM

## 2015-01-25 DIAGNOSIS — Z3481 Encounter for supervision of other normal pregnancy, first trimester: Secondary | ICD-10-CM

## 2015-01-25 DIAGNOSIS — G4452 New daily persistent headache (NDPH): Secondary | ICD-10-CM

## 2015-01-25 DIAGNOSIS — Z3A09 9 weeks gestation of pregnancy: Secondary | ICD-10-CM | POA: Diagnosis not present

## 2015-01-25 DIAGNOSIS — Z3401 Encounter for supervision of normal first pregnancy, first trimester: Secondary | ICD-10-CM | POA: Diagnosis not present

## 2015-01-25 LAB — POCT URINALYSIS DIPSTICK
BILIRUBIN UA: NEGATIVE
Blood, UA: NEGATIVE
GLUCOSE UA: NORMAL
KETONES UA: NEGATIVE
Leukocytes, UA: NEGATIVE
Nitrite, UA: NEGATIVE
Protein, UA: NEGATIVE
SPEC GRAV UA: 1.015
Urobilinogen, UA: NEGATIVE
pH, UA: 6

## 2015-01-25 MED ORDER — BUTALBITAL-APAP-CAFFEINE 50-325-40 MG PO TABS: 1.0000 | ORAL_TABLET | Freq: Four times a day (QID) | ORAL | Status: DC | PRN

## 2015-01-25 NOTE — Progress Notes (Signed)
  Subjective:    Kerry Jimenez is a 19 y.o. female being seen today for her obstetrical visit. She is at [redacted]w[redacted]d gestation. Patient reports: no complaints.  Problem List Items Addressed This Visit    None    Visit Diagnoses    Encounter for supervision of other normal pregnancy in first trimester    -  Primary    Relevant Medications    butalbital-acetaminophen-caffeine (FIORICET) 50-325-40 MG per tablet    Other Relevant Orders    POCT urinalysis dipstick (Completed)    New daily persistent headache        Relevant Medications    butalbital-acetaminophen-caffeine (FIORICET) 50-325-40 MG per tablet    Encounter to determine fetal viability of pregnancy, fetus 1          Patient Active Problem List   Diagnosis Date Noted  . BV (bacterial vaginosis) 12/06/2012  . Condyloma acuminatum 12/06/2012    Objective:     LMP 11/14/2014 (Exact Date) Uterine Size: Below umbilicus    Doppler FHR:  No FHR detected  Assessment:    Pregnancy @ [redacted]w[redacted]d  Weeks by LMP.  No FHR detected with Doppler.  Patient sent to [facility] for ultrasound for viability.  Ultrasound reveals a 9 week viable IUP with FHR of 167bpm and EDC of 08-30-15.   Plan:    Problem list reviewed and updated. Labs reviewed.  Follow up in 2 weeks. FIRST/CF mutation testing/NIPT/QUAD SCREEN/fragile X/Ashkenazi Jewish population testing/Spinal muscular atrophy discussed: requested. Role of ultrasound in pregnancy discussed; fetal survey: requested. Amniocentesis discussed: not indicated.

## 2015-01-25 NOTE — Progress Notes (Signed)
Subjective:    Kerry Jimenez is being seen today for her first obstetrical visit.  This is a planned pregnancy. She is at [redacted]w[redacted]d gestation. Her obstetrical history is significant for none. Relationship with FOB: significant other, living together. Patient does intend to breast feed. Pregnancy history fully reviewed.  Daily HA, all day, has tried tylenol without relief.    The information documented in the HPI was reviewed and verified.  Menstrual History: OB History    Gravida Para Term Preterm AB TAB SAB Ectopic Multiple Living   1 0 0 0 0 0 0 0 0 0       Menarche age: 50  Patient's last menstrual period was 11/14/2014 (exact date).    No past medical history on file.  Past Surgical History  Procedure Laterality Date  . No past surgeries       (Not in a hospital admission) No Known Allergies  Social History  Substance Use Topics  . Smoking status: Never Smoker   . Smokeless tobacco: Never Used  . Alcohol Use: No    Family History  Problem Relation Age of Onset  . Hypertension    . Diabetes    . Cancer Paternal Grandmother      Review of Systems Constitutional: negative for weight loss Gastrointestinal: negative for vomiting Genitourinary:negative for genital lesions and vaginal discharge and dysuria Musculoskeletal:negative for back pain Behavioral/Psych: negative for abusive relationship, depression, illegal drug usage and tobacco use   Daily HA, all day.     Objective:    LMP 11/14/2014 (Exact Date) General Appearance:    Alert, cooperative, no distress, appears stated age  Head:    Normocephalic, without obvious abnormality, atraumatic  Eyes:    PERRL, conjunctiva/corneas clear, EOM's intact, fundi    benign, both eyes  Ears:    Normal TM's and external ear canals, both ears  Nose:   Nares normal, septum midline, mucosa normal, no drainage    or sinus tenderness  Throat:   Lips, mucosa, and tongue normal; teeth and gums normal  Neck:   Supple, symmetrical,  trachea midline, no adenopathy;    thyroid:  no enlargement/tenderness/nodules; no carotid   bruit or JVD  Back:     Symmetric, no curvature, ROM normal, no CVA tenderness  Lungs:     Clear to auscultation bilaterally, respirations unlabored  Chest Wall:    No tenderness or deformity   Heart:    Regular rate and rhythm, S1 and S2 normal, no murmur, rub   or gallop  Breast Exam:    No tenderness, masses, or nipple abnormality  Abdomen:     Soft, non-tender, bowel sounds active all four quadrants,    no masses, no organomegaly  Genitalia:    Normal female without lesion, discharge or tenderness  Extremities:   Extremities normal, atraumatic, no cyanosis or edema  Pulses:   2+ and symmetric all extremities  Skin:   Skin color, texture, turgor normal, no rashes or lesions  Lymph nodes:   Cervical, supraclavicular, and axillary nodes normal  Neurologic:   CNII-XII intact, normal strength, sensation and reflexes    throughout          Lab Review Urine pregnancy test Labs reviewed yes Radiologic studies reviewed no Assessment:    Pregnancy at [redacted]w[redacted]d weeks   LMP unknown   Plan:    Sent to ultrasound for viability and dating scan.  Draw labs/Sure swab at next ROB visit.  Prenatal vitamins.  Counseling provided regarding continued use  of seat belts, cessation of alcohol consumption, smoking or use of illicit drugs; infection precautions i.e., influenza/TDAP immunizations, toxoplasmosis,CMV, parvovirus, listeria and varicella; workplace safety, exercise during pregnancy; routine dental care, safe medications, sexual activity, hot tubs, saunas, pools, travel, caffeine use, fish and methlymercury, potential toxins, hair treatments, varicose veins Weight gain recommendations per IOM guidelines reviewed: underweight/BMI< 18.5--> gain 28 - 40 lbs; normal weight/BMI 18.5 - 24.9--> gain 25 - 35 lbs; overweight/BMI 25 - 29.9--> gain 15 - 25 lbs; obese/BMI >30->gain  11 - 20 lbs Problem list reviewed  and updated. FIRST/CF mutation testing/NIPT/QUAD SCREEN/fragile X/Ashkenazi Jewish population testing/Spinal muscular atrophy discussed: requested. Role of ultrasound in pregnancy discussed; fetal survey: requested. Amniocentesis discussed: not indicated. VBAC calculator score: VBAC consent form provided No orders of the defined types were placed in this encounter.   No orders of the defined types were placed in this encounter.    Follow up in 4 weeks. 50% of 30 min visit spent on counseling and coordination of care.

## 2015-01-27 ENCOUNTER — Inpatient Hospital Stay (HOSPITAL_COMMUNITY): Payer: Managed Care, Other (non HMO)

## 2015-01-27 ENCOUNTER — Encounter (HOSPITAL_COMMUNITY): Payer: Self-pay | Admitting: *Deleted

## 2015-01-27 ENCOUNTER — Inpatient Hospital Stay (HOSPITAL_COMMUNITY)
Admission: AD | Admit: 2015-01-27 | Discharge: 2015-01-27 | Disposition: A | Discharge status: Home / Self Care | Payer: Managed Care, Other (non HMO) | Source: Ambulatory Visit | Attending: Obstetrics | Admitting: Obstetrics

## 2015-01-27 DIAGNOSIS — O209 Hemorrhage in early pregnancy, unspecified: Secondary | ICD-10-CM | POA: Diagnosis not present

## 2015-01-27 DIAGNOSIS — O4691 Antepartum hemorrhage, unspecified, first trimester: Secondary | ICD-10-CM

## 2015-01-27 DIAGNOSIS — Z3A09 9 weeks gestation of pregnancy: Secondary | ICD-10-CM | POA: Insufficient documentation

## 2015-01-27 DIAGNOSIS — N939 Abnormal uterine and vaginal bleeding, unspecified: Secondary | ICD-10-CM | POA: Diagnosis present

## 2015-01-27 LAB — URINALYSIS, ROUTINE W REFLEX MICROSCOPIC
BILIRUBIN URINE: NEGATIVE
GLUCOSE, UA: NEGATIVE mg/dL
Ketones, ur: NEGATIVE mg/dL
Leukocytes, UA: NEGATIVE
Nitrite: NEGATIVE
PH: 5.5 (ref 5.0–8.0)
Protein, ur: NEGATIVE mg/dL
Specific Gravity, Urine: <1.005 — ABNORMAL LOW (ref 1.005–1.030)
UROBILINOGEN UA: 0.2 mg/dL (ref 0.0–1.0)

## 2015-01-27 LAB — URINE MICROSCOPIC-ADD ON

## 2015-01-27 NOTE — MAU Note (Signed)
Pt c/o vaginal bleeding when she wipes that started about 20 mins. Pt not wearing pad. Also having cramps but this is something that she has been experiencing.

## 2015-01-27 NOTE — MAU Provider Note (Signed)
Subjective:   Pt is a 19 y.o. G1P0000 here for vaginal bleeding.  Upon review of the records patient was first seen on 01/12/15 for vaginal discharge and itching.   BHCG was not drawn.  GC/CT and wet prep were collected.  Results were negative .Pt had initial prenatal care appt on 01/25/15 ultrasound showed and IUP at 9 wks.     Pt here today with of vaginal bleeding.   Bleeding is described as light with no clots.  Denies abdominal pain.     Objective: Physical Exam  Filed Vitals:   01/27/15 2128  BP: 119/71  Pulse: 87  Temp: 98.7 F (37.1 C)  Resp: 16   Constitutional: She is oriented to person, place, and time. She appears well-developed and well-nourished. No distress.  Neck: Normal range of motion.  Pulmonary/Chest: Effort normal. No respiratory distress.  Musculoskeletal: Normal range of motion.  Abdomen:  nt with palpation Neurological: She is alert and oriented to person, place, and time.  Pelvic:  Scant amount of vaginal bleeding, cervix closed Skin: Skin is warm and dry.   Results for orders placed or performed during the hospital encounter of 01/27/15 (from the past 24 hour(s))  Urinalysis, Routine w reflex microscopic (not at [facility])     Status: Abnormal   Collection Time: 01/27/15  9:26 PM  Result Value Ref Range   Color, Urine YELLOW YELLOW   APPearance CLEAR CLEAR   Specific Gravity, Urine <1.005 (L) 1.005 - 1.030   pH 5.5 5.0 - 8.0   Glucose, UA NEGATIVE NEGATIVE mg/dL   Hgb urine dipstick MODERATE (A) NEGATIVE   Bilirubin Urine NEGATIVE NEGATIVE   Ketones, ur NEGATIVE NEGATIVE mg/dL   Protein, ur NEGATIVE NEGATIVE mg/dL   Urobilinogen, UA 0.2 0.0 - 1.0 mg/dL   Nitrite NEGATIVE NEGATIVE   Leukocytes, UA NEGATIVE NEGATIVE  Urine microscopic-add on     Status: None   Collection Time: 01/27/15  9:26 PM  Result Value Ref Range   Squamous Epithelial / LPF RARE RARE   WBC, UA 0-2 <3 WBC/hpf   RBC / HPF 0-2 <3 RBC/hpf   Bacteria, UA RARE RARE  ABO/Rh     Status: None  (Preliminary result)   Collection Time: 01/27/15 10:07 PM  Result Value Ref Range   ABO/RH(D) O POS    Ultrasound: Maternal uterus/adnexae: No subchorionic hemorrhage is noted. The uterus is otherwise unremarkable.  The ovaries are within normal limits. The right ovary measures 3.7 x 2.0 x 2.4 cm, while the left ovary measures 2.9 x 1.2 x 1.5 cm. No suspicious adnexal masses are seen; there is no evidence for ovarian torsion.  No free fluid is seen within the pelvic cul-de-sac.  IMPRESSION: Single live intrauterine pregnancy noted, with a crown-rump length of 2.4 cm, corresponding to a gestational age of [redacted] weeks 1 day. This matches the gestational age of [redacted] weeks 2 days by prior ultrasound, reflecting an estimated date of delivery of August 30, 2015.  Assessment: 19 y.o. G1P0000 at [redacted]w[redacted]d wks Pregnancy Vaginal Bleeding in Pregnancy  Plan: Discharge to home Bleeding precautions Keep scheduled appt with Ann Lions, CNM

## 2015-01-27 NOTE — Discharge Instructions (Signed)

## 2015-01-28 LAB — ABO/RH: ABO/RH(D): O POS

## 2015-02-08 ENCOUNTER — Encounter: Payer: Self-pay | Admitting: Certified Nurse Midwife

## 2015-02-08 ENCOUNTER — Ambulatory Visit (INDEPENDENT_AMBULATORY_CARE_PROVIDER_SITE_OTHER): Payer: Managed Care, Other (non HMO) | Admitting: Certified Nurse Midwife

## 2015-02-08 ENCOUNTER — Encounter: Payer: Managed Care, Other (non HMO) | Admitting: Certified Nurse Midwife

## 2015-02-08 VITALS — BP 119/77 | HR 73 | Temp 99.1°F | Wt 126.3 lb

## 2015-02-08 DIAGNOSIS — Z3401 Encounter for supervision of normal first pregnancy, first trimester: Secondary | ICD-10-CM

## 2015-02-08 DIAGNOSIS — Z3400 Encounter for supervision of normal first pregnancy, unspecified trimester: Secondary | ICD-10-CM | POA: Insufficient documentation

## 2015-02-08 LAB — POCT URINALYSIS DIPSTICK
Bilirubin, UA: NEGATIVE
Glucose, UA: NORMAL
KETONES UA: NEGATIVE
LEUKOCYTES UA: NEGATIVE
Nitrite, UA: NEGATIVE
PH UA: 6
RBC UA: NEGATIVE
SPEC GRAV UA: 1.02
Urobilinogen, UA: NEGATIVE

## 2015-02-08 MED ORDER — VITAFOL ULTRA 29-0.6-0.4-200 MG PO CAPS: 1.0000 | ORAL_CAPSULE | Freq: Every day | ORAL | Status: DC

## 2015-02-08 NOTE — Progress Notes (Signed)
  Subjective:    Kerry Jimenez is a 19 y.o. female being seen today for her obstetrical visit. She is at [redacted]w[redacted]d gestation. Patient reports: headache, no bleeding, no contractions, no cramping and no leaking.  Finishing up initial prenatal visit with lab work and pelvic exam.   Problem List Items Addressed This Visit    None    Visit Diagnoses    Supervision of normal first pregnancy in first trimester    -  Primary    Relevant Medications    Prenat-Fe Poly-Methfol-FA-DHA (VITAFOL ULTRA) 29-0.6-0.4-200 MG CAPS    Other Relevant Orders    Obstetric panel    HIV antibody    Hemoglobinopathy evaluation    Varicella zoster antibody, IgG    Vit D  25 hydroxy (rtn osteoporosis monitoring)    Culture, OB Urine    TSH      Patient Active Problem List   Diagnosis Date Noted  . BV (bacterial vaginosis) 12/06/2012  . Condyloma acuminatum 12/06/2012    Objective:     BP 119/77 mmHg  Pulse 73  Temp(Src) 99.1 F (37.3 C)  Wt 126 lb 4.8 oz (57.289 kg)  LMP 11/14/2014 (Exact Date) Uterine Size: Below umbilicus   FHR seen with bedside ultrasound along with fetal movement.    Cervix:  Long, thick, posterior and closed.  Assessment:    Pregnancy @ [redacted]w[redacted]d  weeks Doing well    Plan:    Problem list reviewed and updated. Labs reviewed. Change in PNV d/t the Citranatal causing her HA.   Follow up in 4 weeks. FIRST/CF mutation testing/NIPT/QUAD SCREEN/fragile X/Ashkenazi Jewish population testing/Spinal muscular atrophy discussed: requested. Role of ultrasound in pregnancy discussed; fetal survey: requested. Amniocentesis discussed: not indicated. 50% of 25 minute visit spent on counseling and coordination of care.

## 2015-02-09 LAB — VITAMIN D 25 HYDROXY (VIT D DEFICIENCY, FRACTURES): Vit D, 25-Hydroxy: 22 ng/mL — ABNORMAL LOW (ref 30–100)

## 2015-02-09 LAB — CULTURE, OB URINE
Colony Count: NO GROWTH
Organism ID, Bacteria: NO GROWTH

## 2015-02-09 LAB — TSH: TSH: 1.871 u[IU]/mL (ref 0.350–4.500)

## 2015-02-09 LAB — HIV ANTIBODY (ROUTINE TESTING W REFLEX): HIV 1&2 Ab, 4th Generation: NONREACTIVE

## 2015-02-11 LAB — OBSTETRIC PANEL
Antibody Screen: NEGATIVE
BASOS ABS: 0.1 10*3/uL (ref 0.0–0.1)
Basophils Relative: 1 % (ref 0–1)
EOS ABS: 0.1 10*3/uL (ref 0.0–0.7)
EOS PCT: 1 % (ref 0–5)
HCT: 36.2 % (ref 36.0–46.0)
HEMOGLOBIN: 11.4 g/dL — AB (ref 12.0–15.0)
Hepatitis B Surface Ag: NEGATIVE
LYMPHS PCT: 31 % (ref 12–46)
Lymphs Abs: 1.8 10*3/uL (ref 0.7–4.0)
MCH: 26.1 pg (ref 26.0–34.0)
MCHC: 31.5 g/dL (ref 30.0–36.0)
MCV: 83 fL (ref 78.0–100.0)
MPV: 10.8 fL (ref 8.6–12.4)
Monocytes Absolute: 0.4 10*3/uL (ref 0.1–1.0)
Monocytes Relative: 7 % (ref 3–12)
NEUTROS PCT: 60 % (ref 43–77)
Neutro Abs: 3.4 10*3/uL (ref 1.7–7.7)
Platelets: 178 10*3/uL (ref 150–400)
RBC: 4.36 MIL/uL (ref 3.87–5.11)
RDW: 15 % (ref 11.5–15.5)
Rh Type: POSITIVE
Rubella: 6.16 Index — ABNORMAL HIGH (ref <0.90)
WBC: 5.7 10*3/uL (ref 4.0–10.5)

## 2015-02-11 LAB — VARICELLA ZOSTER ANTIBODY, IGG: VARICELLA IGG: 34.2 {index} (ref <135.00)

## 2015-02-12 LAB — HEMOGLOBINOPATHY EVALUATION
HEMOGLOBIN OTHER: 0 %
HGB S QUANTITAION: 0 %
Hgb A2 Quant: 2.4 % (ref 2.2–3.2)
Hgb A: 97.6 % (ref 96.8–97.8)
Hgb F Quant: 0 % (ref 0.0–2.0)

## 2015-02-13 LAB — SURESWAB, VAGINOSIS/VAGINITIS PLUS
ATOPOBIUM VAGINAE: NOT DETECTED Log (cells/mL)
C. GLABRATA, DNA: NOT DETECTED
C. albicans, DNA: NOT DETECTED
C. parapsilosis, DNA: NOT DETECTED
C. trachomatis RNA, TMA: NOT DETECTED
C. tropicalis, DNA: NOT DETECTED
LACTOBACILLUS SPECIES: NOT DETECTED Log (cells/mL)
MEGASPHAERA SPECIES: NOT DETECTED Log (cells/mL)
N. GONORRHOEAE RNA, TMA: NOT DETECTED
T. vaginalis RNA, QL TMA: NOT DETECTED

## 2015-02-27 ENCOUNTER — Other Ambulatory Visit: Payer: Self-pay | Admitting: Certified Nurse Midwife

## 2015-03-07 ENCOUNTER — Ambulatory Visit (INDEPENDENT_AMBULATORY_CARE_PROVIDER_SITE_OTHER): Payer: Managed Care, Other (non HMO) | Admitting: Certified Nurse Midwife

## 2015-03-07 VITALS — BP 116/57 | HR 83 | Temp 98.3°F | Wt 127.0 lb

## 2015-03-07 DIAGNOSIS — O219 Vomiting of pregnancy, unspecified: Secondary | ICD-10-CM

## 2015-03-07 DIAGNOSIS — Z3402 Encounter for supervision of normal first pregnancy, second trimester: Secondary | ICD-10-CM

## 2015-03-07 LAB — POCT URINALYSIS DIPSTICK
Blood, UA: NEGATIVE
Glucose, UA: NEGATIVE
KETONES UA: NEGATIVE
LEUKOCYTES UA: NEGATIVE
NITRITE UA: NEGATIVE
PH UA: 5
Spec Grav, UA: 1.02

## 2015-03-07 MED ORDER — ONDANSETRON HCL 4 MG PO TABS: 4.0000 mg | ORAL_TABLET | Freq: Every day | ORAL | Status: DC | PRN

## 2015-03-07 NOTE — Progress Notes (Signed)
  Subjective:    Kerry Jimenez is a 19 y.o. female being seen today for her obstetrical visit. She is at [redacted]w[redacted]d gestation. Patient reports: no complaints.  Problem List Items Addressed This Visit    None    Visit Diagnoses    Supervision of normal first pregnancy in second trimester    -  Primary    Relevant Orders    POCT urinalysis dipstick (Completed)    AFP, Quad Screen    US OB Comp + 14 Wk    Nausea/vomiting in pregnancy        Relevant Medications    ondansetron (ZOFRAN) 4 MG tablet      Patient Active Problem List   Diagnosis Date Noted  . Supervision of normal first pregnancy 02/08/2015  . BV (bacterial vaginosis) 12/06/2012  . Condyloma acuminatum 12/06/2012    Objective:     BP 116/57 mmHg  Pulse 83  Temp(Src) 98.3 F (36.8 C)  Wt 127 lb (57.607 kg)  LMP 11/14/2014 (Exact Date) Uterine Size: Below umbilicus   FHR: 155-160 by doppler  Assessment:    Pregnancy @ [redacted]w[redacted]d  weeks Doing well    Plan:   List of classes at [facility] given.    Problem list reviewed and updated. Labs reviewed. Follow up in 4 weeks. FIRST/CF mutation testing/NIPT/QUAD SCREEN/fragile X/Ashkenazi Jewish population testing/Spinal muscular atrophy discussed: ordered. Role of ultrasound in pregnancy discussed; fetal survey: ordered. Amniocentesis discussed: not indicated. 50% of 15 minute visit spent on counseling and coordination of care.

## 2015-03-07 NOTE — Progress Notes (Signed)
Patient reports she is doing well- she has questions about pregnancy and dangers

## 2015-03-08 LAB — AFP, QUAD SCREEN
AFP: 57.8 ng/mL
CURR GEST AGE: 14.6 wks.days
HCG TOTAL: 66.67 [IU]/mL
INH: 112.4 pg/mL
INTERPRETATION-AFP: NEGATIVE
MOM FOR AFP: 1.6
MOM FOR HCG: 1.04
MoM for INH: 0.56
Open Spina bifida: NEGATIVE
Osb Risk: 1:4150 {titer}
Tri 18 Scr Risk Est: NEGATIVE
UE3 MOM: 1.48
UE3 VALUE: 0.93 ng/mL

## 2015-04-04 ENCOUNTER — Other Ambulatory Visit: Payer: Managed Care, Other (non HMO)

## 2015-04-04 ENCOUNTER — Encounter: Payer: Managed Care, Other (non HMO) | Admitting: Certified Nurse Midwife

## 2015-04-11 ENCOUNTER — Ambulatory Visit (INDEPENDENT_AMBULATORY_CARE_PROVIDER_SITE_OTHER): Payer: Managed Care, Other (non HMO)

## 2015-04-11 ENCOUNTER — Ambulatory Visit (INDEPENDENT_AMBULATORY_CARE_PROVIDER_SITE_OTHER): Payer: Managed Care, Other (non HMO) | Admitting: Certified Nurse Midwife

## 2015-04-11 VITALS — BP 127/63 | HR 82 | Temp 98.1°F | Wt 132.0 lb

## 2015-04-11 DIAGNOSIS — R519 Headache, unspecified: Secondary | ICD-10-CM

## 2015-04-11 DIAGNOSIS — Z3402 Encounter for supervision of normal first pregnancy, second trimester: Secondary | ICD-10-CM

## 2015-04-11 DIAGNOSIS — R51 Headache: Secondary | ICD-10-CM

## 2015-04-11 DIAGNOSIS — Z36 Encounter for antenatal screening of mother: Secondary | ICD-10-CM | POA: Diagnosis not present

## 2015-04-11 DIAGNOSIS — O26892 Other specified pregnancy related conditions, second trimester: Secondary | ICD-10-CM

## 2015-04-11 LAB — POCT URINALYSIS DIPSTICK
Bilirubin, UA: NEGATIVE
Glucose, UA: NEGATIVE
Ketones, UA: NEGATIVE
LEUKOCYTES UA: NEGATIVE
NITRITE UA: NEGATIVE
PH UA: 7
PROTEIN UA: NEGATIVE
RBC UA: NEGATIVE
Spec Grav, UA: <=1.005
UROBILINOGEN UA: NEGATIVE

## 2015-04-11 MED ORDER — BUTALBITAL-APAP-CAFFEINE 50-325-40 MG PO TABS: 1.0000 | ORAL_TABLET | Freq: Four times a day (QID) | ORAL | Status: DC | PRN

## 2015-04-11 NOTE — Progress Notes (Signed)
Subjective:    Kerry Jimenez is a 19 y.o. female being seen today for her obstetrical visit. She is at 4553w6d gestation. Patient reports: no complaints . Fetal movement: normal.  Problem List Items Addressed This Visit    None    Visit Diagnoses    Encounter for supervision of normal first pregnancy in second trimester    -  Primary    Relevant Orders    POCT urinalysis dipstick (Completed)    Headache in pregnancy, second trimester        Relevant Medications    butalbital-acetaminophen-caffeine (FIORICET) 50-325-40 MG tablet      Patient Active Problem List   Diagnosis Date Noted  . Supervision of normal first pregnancy 02/08/2015  . BV (bacterial vaginosis) 12/06/2012  . Condyloma acuminatum 12/06/2012   Objective:    BP 127/63 mmHg  Pulse 82  Temp(Src) 98.1 F (36.7 C)  Wt 132 lb (59.875 kg)  LMP 11/14/2014 (Exact Date) FHT: 150 BPM  Uterine Size: size equals dates and at U.     Assessment:    Pregnancy @ 5153w6d    Doing well. Plan:    OBGCT: discussed. Signs and symptoms of preterm labor: discussed.  Labs, problem list reviewed and updated 2 hr GTT planned Follow up in 4 weeks.

## 2015-05-08 ENCOUNTER — Ambulatory Visit (INDEPENDENT_AMBULATORY_CARE_PROVIDER_SITE_OTHER): Payer: Managed Care, Other (non HMO) | Admitting: Certified Nurse Midwife

## 2015-05-08 VITALS — BP 117/72 | HR 86 | Temp 98.9°F | Wt 134.0 lb

## 2015-05-08 DIAGNOSIS — Z3402 Encounter for supervision of normal first pregnancy, second trimester: Secondary | ICD-10-CM

## 2015-05-08 LAB — POCT URINALYSIS DIPSTICK
Bilirubin, UA: NEGATIVE
Blood, UA: NEGATIVE
Glucose, UA: NEGATIVE
Ketones, UA: NEGATIVE
Leukocytes, UA: NEGATIVE
Nitrite, UA: NEGATIVE
PROTEIN UA: NEGATIVE
SPEC GRAV UA: 1.015
UROBILINOGEN UA: NEGATIVE
pH, UA: 5.5

## 2015-05-08 NOTE — Progress Notes (Signed)
Subjective:    Kerry Jimenez is a 19 y.o. female being seen today for her obstetrical visit. She is at 5880w5d gestation. Patient reports: no complaints . Fetal movement: normal.  Problem List Items Addressed This Visit    None    Visit Diagnoses    Encounter for supervision of normal first pregnancy in second trimester    -  Primary    Relevant Orders    POCT urinalysis dipstick      Patient Active Problem List   Diagnosis Date Noted  . Supervision of normal first pregnancy 02/08/2015  . BV (bacterial vaginosis) 12/06/2012  . Condyloma acuminatum 12/06/2012   Objective:    BP 117/72 mmHg  Pulse 86  Temp(Src) 98.9 F (37.2 C)  Wt 134 lb (60.782 kg)  LMP 11/14/2014 (Exact Date) FHT: 160 BPM  Uterine Size: size equals dates     Assessment:    Pregnancy @ 5580w5d    Plan:    OBGCT: discussed and ordered for next visit. Signs and symptoms of preterm labor: discussed.  Labs, problem list reviewed and updated 2 hr GTT planned Follow up in 4 weeks.

## 2015-05-10 ENCOUNTER — Encounter: Payer: Managed Care, Other (non HMO) | Admitting: Certified Nurse Midwife

## 2015-05-15 ENCOUNTER — Other Ambulatory Visit: Payer: Self-pay | Admitting: Certified Nurse Midwife

## 2015-06-05 ENCOUNTER — Ambulatory Visit (INDEPENDENT_AMBULATORY_CARE_PROVIDER_SITE_OTHER): Payer: Managed Care, Other (non HMO) | Admitting: Certified Nurse Midwife

## 2015-06-05 ENCOUNTER — Other Ambulatory Visit: Payer: Managed Care, Other (non HMO)

## 2015-06-05 VITALS — BP 130/72 | HR 86 | Temp 98.5°F | Wt 138.0 lb

## 2015-06-05 DIAGNOSIS — Z3402 Encounter for supervision of normal first pregnancy, second trimester: Secondary | ICD-10-CM

## 2015-06-05 LAB — POCT URINALYSIS DIPSTICK
BILIRUBIN UA: NEGATIVE
Blood, UA: NEGATIVE
Glucose, UA: NEGATIVE
Ketones, UA: NEGATIVE
Leukocytes, UA: NEGATIVE
NITRITE UA: NEGATIVE
PH UA: 6
Protein, UA: NEGATIVE
Spec Grav, UA: 1.015
UROBILINOGEN UA: NEGATIVE

## 2015-06-05 NOTE — Progress Notes (Signed)
Subjective:    Kerry Jimenez is a 19 y.o. female being seen today for her obstetrical visit. She is at 2915w5d gestation. Patient reports: no complaints . Fetal movement: normal.  States that she is working a lot of hours.  Labor and delivery classes encouraged.    Problem List Items Addressed This Visit      Other   Supervision of normal first pregnancy - Primary   Relevant Orders   Glucose Tolerance, 2 Hours w/1 Hour   CBC   HIV antibody   RPR   POCT urinalysis dipstick (Completed)     Patient Active Problem List   Diagnosis Date Noted  . Supervision of normal first pregnancy 02/08/2015  . BV (bacterial vaginosis) 12/06/2012  . Condyloma acuminatum 12/06/2012   Objective:    BP 130/72 mmHg  Pulse 86  Temp(Src) 98.5 F (36.9 C)  Wt 138 lb (62.596 kg)  LMP 11/14/2014 (Exact Date) FHT: 145 BPM  Uterine Size: size equals dates     Assessment:    Pregnancy @ 6015w5d    Doing well.  Plan:    OBGCT: ordered. Signs and symptoms of preterm labor: discussed.  Labs, problem list reviewed and updated 2 hr GTT today Follow up in 2 weeks.

## 2015-06-06 ENCOUNTER — Other Ambulatory Visit: Payer: Self-pay | Admitting: Certified Nurse Midwife

## 2015-06-06 DIAGNOSIS — Z3402 Encounter for supervision of normal first pregnancy, second trimester: Secondary | ICD-10-CM

## 2015-06-06 LAB — CBC
HCT: 31.1 % — ABNORMAL LOW (ref 36.0–46.0)
HEMOGLOBIN: 10.3 g/dL — AB (ref 12.0–15.0)
MCH: 27.4 pg (ref 26.0–34.0)
MCHC: 33.1 g/dL (ref 30.0–36.0)
MCV: 82.7 fL (ref 78.0–100.0)
MPV: 10.2 fL (ref 8.6–12.4)
PLATELETS: 189 10*3/uL (ref 150–400)
RBC: 3.76 MIL/uL — AB (ref 3.87–5.11)
RDW: 14.6 % (ref 11.5–15.5)
WBC: 7.1 10*3/uL (ref 4.0–10.5)

## 2015-06-06 LAB — GLUCOSE TOLERANCE, 2 HOURS W/ 1HR
GLUCOSE: 76 mg/dL (ref 70–170)
Glucose, 2 hour: 101 mg/dL (ref 70–139)
Glucose, Fasting: 75 mg/dL (ref 65–99)

## 2015-06-06 LAB — RPR

## 2015-06-06 LAB — HIV ANTIBODY (ROUTINE TESTING W REFLEX): HIV 1&2 Ab, 4th Generation: NONREACTIVE

## 2015-06-06 MED ORDER — VITAFOL FE+ 90-1-200 & 50 MG PO CPPK: 2.0000 | ORAL_CAPSULE | Freq: Every day | ORAL | Status: DC

## 2015-06-16 NOTE — L&D Delivery Note (Signed)
Delivery Note  This is a 20 year old G 1 P0 who was admitted for Early latent labor. and preeclampsia. She progressed normally with cytotec, then epidural and small amount of pitocin at the end to the second stage of labor.  She pushed for 30 min.  At 0859 she delivered a viable infant female, cephalic, over an intact perineum.  A nuchal cord   was not identified. Infant placed on maternal abdomen.  Delayed cord clamping was performed for 12 minutes.  Cord double clamped and cut.  Apgar scores were 8 and 10. The placenta delivered spontaneously, shultz, with a 3 vessel cord.  Inspection revealed none. The uterus was firm bleeding stable.  EBL was 600.    Placenta and umbilical artery blood gas were not sent.  There were no complications during the procedure.  Mom and baby skin to skin following delivery. Left in stable condition.  Vaginal, Spontaneous Delivery (Presentation: Right Occiput Anterior).  APGAR: 8, 10; weight  .   Placenta status: Intact, Spontaneous Retained.  Cord: 3 vessels with the following complications: .  Cord pH: N/A  Anesthesia: Epidural  Episiotomy: None Lacerations: None Suture Repair: none Est. Blood Loss (mL): 600  Mom to postpartum.  Baby to Couplet care / Skin to Skin.  Roe Coombsachelle A Sherley Leser, CNM 08/26/2015, 9:42 AM

## 2015-06-18 ENCOUNTER — Ambulatory Visit (INDEPENDENT_AMBULATORY_CARE_PROVIDER_SITE_OTHER): Payer: Managed Care, Other (non HMO) | Admitting: Certified Nurse Midwife

## 2015-06-18 VITALS — BP 137/79 | HR 98 | Wt 137.0 lb

## 2015-06-18 DIAGNOSIS — Z3402 Encounter for supervision of normal first pregnancy, second trimester: Secondary | ICD-10-CM

## 2015-06-18 LAB — POCT URINALYSIS DIPSTICK
BILIRUBIN UA: NEGATIVE
GLUCOSE UA: NEGATIVE
Ketones, UA: NEGATIVE
Leukocytes, UA: NEGATIVE
Nitrite, UA: NEGATIVE
Protein, UA: NEGATIVE
RBC UA: NEGATIVE
SPEC GRAV UA: 1.015
Urobilinogen, UA: NEGATIVE
pH, UA: 6

## 2015-06-18 MED ORDER — VITAFOL FE+ 90-1-200 & 50 MG PO CPPK: 2.0000 | ORAL_CAPSULE | Freq: Every day | ORAL | Status: DC

## 2015-06-18 NOTE — Progress Notes (Signed)
Subjective:    Kerry Jimenez is a 20 y.o. female being seen today for her obstetrical visit. She is at 663w4d gestation. Patient reports no complaints. Fetal movement: normal.  Problem List Items Addressed This Visit      Other   Supervision of normal first pregnancy - Primary   Relevant Medications   Prenat-FePoly-Metf-FA-DHA-DSS (VITAFOL FE+) 90-1-200 & 50 MG CPPK   Other Relevant Orders   POCT urinalysis dipstick (Completed)     Patient Active Problem List   Diagnosis Date Noted  . Supervision of normal first pregnancy 02/08/2015  . BV (bacterial vaginosis) 12/06/2012  . Condyloma acuminatum 12/06/2012   Objective:    BP 137/79 mmHg  Pulse 98  Wt 137 lb (62.143 kg)  LMP 11/14/2014 (Exact Date) FHT:  150 BPM  Uterine Size: size equals dates  Presentation: cephalic     Assessment:    Pregnancy @ 3363w4d weeks   Plan:     labs reviewed, problem list updated Consent signed. GBS sent TDAP offered  Rhogam given for RH negative Pediatrician: discussed. Infant feeding: plans to breastfeed. Maternity leave: discussed. Cigarette smoking: never smoked. Orders Placed This Encounter  Procedures  . POCT urinalysis dipstick   Meds ordered this encounter  Medications  . Prenat-FePoly-Metf-FA-DHA-DSS (VITAFOL FE+) 90-1-200 & 50 MG CPPK    Sig: Take 2 tablets by mouth daily.    Dispense:  60 each    Refill:  12   Follow up in 2 Weeks.

## 2015-06-18 NOTE — Progress Notes (Signed)
Having some crampiness

## 2015-07-02 ENCOUNTER — Ambulatory Visit (INDEPENDENT_AMBULATORY_CARE_PROVIDER_SITE_OTHER): Payer: Medicaid Other | Admitting: Certified Nurse Midwife

## 2015-07-02 VITALS — BP 115/74 | HR 84 | Temp 98.6°F | Wt 137.0 lb

## 2015-07-02 DIAGNOSIS — Z3403 Encounter for supervision of normal first pregnancy, third trimester: Secondary | ICD-10-CM

## 2015-07-02 DIAGNOSIS — M545 Low back pain, unspecified: Secondary | ICD-10-CM

## 2015-07-02 LAB — POCT URINALYSIS DIPSTICK
Bilirubin, UA: NEGATIVE
Blood, UA: NEGATIVE
Glucose, UA: NEGATIVE
Ketones, UA: NEGATIVE
LEUKOCYTES UA: NEGATIVE
NITRITE UA: NEGATIVE
PH UA: 6
PROTEIN UA: NEGATIVE
Spec Grav, UA: 1.015
Urobilinogen, UA: NEGATIVE

## 2015-07-02 NOTE — Progress Notes (Signed)
Subjective:    Kerry Jimenez is a 20 y.o. female being seen today for her obstetrical visit. She is at [redacted]w[redacted]d gestation. Patient reports backache, no bleeding, no contractions, no cramping and no leaking. Fetal movement: normal.  Problem List Items Addressed This Visit    None    Visit Diagnoses    Encounter for supervision of normal first pregnancy in third trimester    -  Primary    Relevant Orders    POCT urinalysis dipstick (Completed)    SureSwab, Vaginosis/Vaginitis Plus      Patient Active Problem List   Diagnosis Date Noted  . Supervision of normal first pregnancy 02/08/2015  . BV (bacterial vaginosis) 12/06/2012  . Condyloma acuminatum 12/06/2012   Objective:    BP 115/74 mmHg  Pulse 84  Temp(Src) 98.6 F (37 C)  Wt 137 lb (62.143 kg)  LMP 11/14/2014 (Exact Date) FHT:  145 BPM  Uterine Size: size equals dates  Presentation: cephalic     Assessment:    Pregnancy @ [redacted]w[redacted]d weeks   Lumbar back pain r/t pregnancy  Plan:   Rx: maternity abdominal support belt   labs reviewed, problem list updated Consent signed. GBS planning TDAP offered  Rhogam given for RH negative Pediatrician: discussed. Infant feeding: plans to breastfeed. Maternity leave: discussed. Cigarette smoking: never smoked. Orders Placed This Encounter  Procedures  . SureSwab, Vaginosis/Vaginitis Plus  . POCT urinalysis dipstick   No orders of the defined types were placed in this encounter.   Follow up in 2 Weeks.

## 2015-07-02 NOTE — Addendum Note (Signed)
Addended by: Marya Landry D on: 07/02/2015 04:13 PM   Modules accepted: Orders

## 2015-07-16 ENCOUNTER — Ambulatory Visit (INDEPENDENT_AMBULATORY_CARE_PROVIDER_SITE_OTHER): Payer: Medicaid Other | Admitting: Certified Nurse Midwife

## 2015-07-16 VITALS — BP 118/78 | HR 106 | Temp 99.4°F | Wt 138.0 lb

## 2015-07-16 DIAGNOSIS — Z3403 Encounter for supervision of normal first pregnancy, third trimester: Secondary | ICD-10-CM

## 2015-07-16 NOTE — Progress Notes (Signed)
Subjective:    Kerry Jimenez is a 20 y.o. female being seen today for her obstetrical visit. She is at [redacted]w[redacted]d gestation. Patient reports no complaints. Fetal movement: normal.  Problem List Items Addressed This Visit    None    Visit Diagnoses    Encounter for supervision of normal first pregnancy in third trimester    -  Primary    Relevant Orders    POCT urinalysis dipstick      Patient Active Problem List   Diagnosis Date Noted  . Lumbar back pain 07/02/2015  . Supervision of normal first pregnancy 02/08/2015  . BV (bacterial vaginosis) 12/06/2012  . Condyloma acuminatum 12/06/2012   Objective:    BP 118/78 mmHg  Pulse 106  Temp(Src) 99.4 F (37.4 C)  Wt 138 lb (62.596 kg)  LMP 11/14/2014 (Exact Date) FHT:  145 BPM  Uterine Size: size equals dates  Presentation: cephalic     Assessment:    Pregnancy @ [redacted]w[redacted]d weeks   Doing well.   Plan:     labs reviewed, problem list updated Consent signed. GBS sent TDAP offered  Rhogam given for RH negative Pediatrician: discussed. Infant feeding: plans to breastfeed. Maternity leave: discussed. Cigarette smoking: never smoked. Orders Placed This Encounter  Procedures  . POCT urinalysis dipstick   No orders of the defined types were placed in this encounter.   Follow up in 2 Weeks with GBS.

## 2015-07-31 ENCOUNTER — Ambulatory Visit (INDEPENDENT_AMBULATORY_CARE_PROVIDER_SITE_OTHER): Payer: Medicaid Other | Admitting: Certified Nurse Midwife

## 2015-07-31 VITALS — BP 123/76 | HR 76 | Temp 98.5°F | Wt 139.0 lb

## 2015-07-31 DIAGNOSIS — Z3403 Encounter for supervision of normal first pregnancy, third trimester: Secondary | ICD-10-CM

## 2015-07-31 LAB — POCT URINALYSIS DIPSTICK
Bilirubin, UA: NEGATIVE
Glucose, UA: NEGATIVE
Ketones, UA: NEGATIVE
Leukocytes, UA: NEGATIVE
NITRITE UA: NEGATIVE
PROTEIN UA: NEGATIVE
RBC UA: NEGATIVE
SPEC GRAV UA: 1.015
UROBILINOGEN UA: NEGATIVE
pH, UA: 7.5

## 2015-08-01 LAB — STREP B DNA PROBE: GBSP: NOT DETECTED

## 2015-08-01 NOTE — Progress Notes (Signed)
Subjective:    Kerry Jimenez is a 20 y.o. female being seen today for her obstetrical visit. She is at [redacted]w[redacted]d gestation. Patient reports backache, no bleeding, no contractions, no cramping and no leaking. Fetal movement: normal.  Problem List Items Addressed This Visit    None    Visit Diagnoses    Encounter for supervision of normal first pregnancy in third trimester    -  Primary    Relevant Orders    POCT urinalysis dipstick (Completed)    Strep B DNA probe    SureSwab, Vaginosis/Vaginitis Plus      Patient Active Problem List   Diagnosis Date Noted  . Lumbar back pain 07/02/2015  . Supervision of normal first pregnancy 02/08/2015  . BV (bacterial vaginosis) 12/06/2012  . Condyloma acuminatum 12/06/2012   Objective:    BP 123/76 mmHg  Pulse 76  Temp(Src) 98.5 F (36.9 C)  Wt 139 lb (63.05 kg)  LMP 11/14/2014 (Exact Date) FHT:  150 BPM  Uterine Size: size equals dates  Presentation: cephalic   Cervix: long, thick, closed and posterior.   Assessment:    Pregnancy @ [redacted]w[redacted]d weeks   Doing well  Plan:     labs reviewed, problem list updated Consent signed. GBS sent TDAP offered  Rhogam given for RH negative Pediatrician: discussed. Infant feeding: plans to breastfeed. Maternity leave: discussed. Cigarette smoking: never smoked. Orders Placed This Encounter  Procedures  . Strep B DNA probe  . SureSwab, Vaginosis/Vaginitis Plus  . POCT urinalysis dipstick   No orders of the defined types were placed in this encounter.   Follow up in 1 Week.

## 2015-08-03 ENCOUNTER — Encounter (HOSPITAL_COMMUNITY): Payer: Self-pay | Admitting: *Deleted

## 2015-08-03 ENCOUNTER — Inpatient Hospital Stay (HOSPITAL_COMMUNITY)
Admission: AD | Admit: 2015-08-03 | Discharge: 2015-08-03 | Disposition: A | Discharge status: Home / Self Care | Payer: BLUE CROSS/BLUE SHIELD | Source: Ambulatory Visit | Attending: Obstetrics | Admitting: Obstetrics

## 2015-08-03 DIAGNOSIS — Z3493 Encounter for supervision of normal pregnancy, unspecified, third trimester: Secondary | ICD-10-CM | POA: Diagnosis present

## 2015-08-03 DIAGNOSIS — Z3A35 35 weeks gestation of pregnancy: Secondary | ICD-10-CM | POA: Diagnosis not present

## 2015-08-03 LAB — URINALYSIS, ROUTINE W REFLEX MICROSCOPIC
BILIRUBIN URINE: NEGATIVE
Glucose, UA: NEGATIVE mg/dL
Hgb urine dipstick: NEGATIVE
Ketones, ur: NEGATIVE mg/dL
Leukocytes, UA: NEGATIVE
NITRITE: NEGATIVE
PROTEIN: NEGATIVE mg/dL
pH: 5.5 (ref 5.0–8.0)

## 2015-08-03 MED ORDER — NIFEDIPINE 10 MG PO CAPS
10.0000 mg | ORAL_CAPSULE | Freq: Once | ORAL | Status: AC
  Administered 2015-08-03: 10 mg via ORAL
  Filled 2015-08-03: qty 1

## 2015-08-03 NOTE — Progress Notes (Signed)
Pt moved and transducer was not picking up FHR. When adjusted FHR in 120s

## 2015-08-03 NOTE — MAU Note (Signed)
Contractions for the last hour. Had more than 10 in an hour. Denies LOF or bleeding

## 2015-08-03 NOTE — Discharge Instructions (Signed)
Braxton Hicks Contractions °Contractions of the uterus can occur throughout pregnancy. Contractions are not always a sign that you are in labor.  °WHAT ARE BRAXTON HICKS CONTRACTIONS?  °Contractions that occur before labor are called Braxton Hicks contractions, or false labor. Toward the end of pregnancy (32-34 weeks), these contractions can develop more often and may become more forceful. This is not true labor because these contractions do not result in opening (dilatation) and thinning of the cervix. They are sometimes difficult to tell apart from true labor because these contractions can be forceful and people have different pain tolerances. You should not feel embarrassed if you go to the hospital with false labor. Sometimes, the only way to tell if you are in true labor is for your health care provider to look for changes in the cervix. °If there are no prenatal problems or other health problems associated with the pregnancy, it is completely safe to be sent home with false labor and await the onset of true labor. °HOW CAN YOU TELL THE DIFFERENCE BETWEEN TRUE AND FALSE LABOR? °False Labor °· The contractions of false labor are usually shorter and not as hard as those of true labor.   °· The contractions are usually irregular.   °· The contractions are often felt in the front of the lower abdomen and in the groin.   °· The contractions may go away when you walk around or change positions while lying down.   °· The contractions get weaker and are shorter lasting as time goes on.   °· The contractions do not usually become progressively stronger, regular, and closer together as with true labor.   °True Labor °· Contractions in true labor last 30-70 seconds, become very regular, usually become more intense, and increase in frequency.   °· The contractions do not go away with walking.   °· The discomfort is usually felt in the top of the uterus and spreads to the lower abdomen and low back.   °· True labor can be  determined by your health care provider with an exam. This will show that the cervix is dilating and getting thinner.   °WHAT TO REMEMBER °· Keep up with your usual exercises and follow other instructions given by your health care provider.   °· Take medicines as directed by your health care provider.   °· Keep your regular prenatal appointments.   °· Eat and drink lightly if you think you are going into labor.   °· If Braxton Hicks contractions are making you uncomfortable:   °¨ Change your position from lying down or resting to walking, or from walking to resting.   °¨ Sit and rest in a tub of warm water.   °¨ Drink 2-3 glasses of water. Dehydration may cause these contractions.   °¨ Do slow and deep breathing several times an hour.   °WHEN SHOULD I SEEK IMMEDIATE MEDICAL CARE? °Seek immediate medical care if: °· Your contractions become stronger, more regular, and closer together.   °· You have fluid leaking or gushing from your vagina.   °· You have a fever.   °· You pass blood-tinged mucus.   °· You have vaginal bleeding.   °· You have continuous abdominal pain.   °· You have low back pain that you never had before.   °· You feel your baby's head pushing down and causing pelvic pressure.   °· Your baby is not moving as much as it used to.   °  °This information is not intended to replace advice given to you by your health care provider. Make sure you discuss any questions you have with your health care   provider. °  °Document Released: 06/01/2005 Document Revised: 06/06/2013 Document Reviewed: 03/13/2013 °Elsevier Interactive Patient Education ©2016 [employer] ° °

## 2015-08-03 NOTE — Progress Notes (Signed)
Dr Clearance Coots updated as to ctx pattern and that pt felt better and ctxs had spaced out with some u/i. Pt stable for d/c home

## 2015-08-03 NOTE — Progress Notes (Signed)
Dr Clearance Coots notified of pt's admission and status. Aware of ctx pattern, sve, FHR reactive. Will give procardia

## 2015-08-03 NOTE — Progress Notes (Signed)
Written and verbal d/c instructions given and understanding voiced. 

## 2015-08-05 LAB — SURESWAB, VAGINOSIS/VAGINITIS PLUS
Atopobium vaginae: NOT DETECTED Log (cells/mL)
C. ALBICANS, DNA: NOT DETECTED
C. GLABRATA, DNA: NOT DETECTED
C. PARAPSILOSIS, DNA: NOT DETECTED
C. TRACHOMATIS RNA, TMA: NOT DETECTED
C. TROPICALIS, DNA: NOT DETECTED
GARDNERELLA VAGINALIS: NOT DETECTED Log (cells/mL)
LACTOBACILLUS SPECIES: NOT DETECTED Log (cells/mL)
MEGASPHAERA SPECIES: NOT DETECTED Log (cells/mL)
N. gonorrhoeae RNA, TMA: NOT DETECTED
T. VAGINALIS RNA, QL TMA: NOT DETECTED

## 2015-08-07 ENCOUNTER — Inpatient Hospital Stay (HOSPITAL_COMMUNITY)
Admission: AD | Admit: 2015-08-07 | Discharge: 2015-08-07 | Disposition: A | Discharge status: Home / Self Care | Payer: BLUE CROSS/BLUE SHIELD | Source: Ambulatory Visit | Attending: Obstetrics | Admitting: Obstetrics

## 2015-08-07 ENCOUNTER — Encounter (HOSPITAL_COMMUNITY): Payer: Self-pay | Admitting: *Deleted

## 2015-08-07 ENCOUNTER — Ambulatory Visit (INDEPENDENT_AMBULATORY_CARE_PROVIDER_SITE_OTHER): Payer: Medicaid Other | Admitting: Certified Nurse Midwife

## 2015-08-07 VITALS — BP 137/87 | HR 91 | Temp 98.7°F | Wt 142.0 lb

## 2015-08-07 DIAGNOSIS — Z3A36 36 weeks gestation of pregnancy: Secondary | ICD-10-CM | POA: Insufficient documentation

## 2015-08-07 DIAGNOSIS — O368990 Maternal care for other specified fetal problems, unspecified trimester, not applicable or unspecified: Secondary | ICD-10-CM

## 2015-08-07 DIAGNOSIS — Z3403 Encounter for supervision of normal first pregnancy, third trimester: Secondary | ICD-10-CM

## 2015-08-07 DIAGNOSIS — O4700 False labor before 37 completed weeks of gestation, unspecified trimester: Secondary | ICD-10-CM | POA: Diagnosis not present

## 2015-08-07 DIAGNOSIS — K219 Gastro-esophageal reflux disease without esophagitis: Secondary | ICD-10-CM

## 2015-08-07 DIAGNOSIS — N760 Acute vaginitis: Secondary | ICD-10-CM

## 2015-08-07 DIAGNOSIS — O4703 False labor before 37 completed weeks of gestation, third trimester: Secondary | ICD-10-CM

## 2015-08-07 HISTORY — DX: Anemia, unspecified: D64.9

## 2015-08-07 LAB — POCT URINALYSIS DIPSTICK
Bilirubin, UA: NEGATIVE
Glucose, UA: NEGATIVE
KETONES UA: NEGATIVE
LEUKOCYTES UA: NEGATIVE
NITRITE UA: NEGATIVE
PH UA: 6
PROTEIN UA: NEGATIVE
RBC UA: NEGATIVE
Spec Grav, UA: 1.01
Urobilinogen, UA: NEGATIVE

## 2015-08-07 LAB — URINE MICROSCOPIC-ADD ON

## 2015-08-07 LAB — URINALYSIS, ROUTINE W REFLEX MICROSCOPIC
Bilirubin Urine: NEGATIVE
Glucose, UA: NEGATIVE mg/dL
HGB URINE DIPSTICK: NEGATIVE
Ketones, ur: NEGATIVE mg/dL
NITRITE: NEGATIVE
Protein, ur: NEGATIVE mg/dL
Specific Gravity, Urine: 1.01 (ref 1.005–1.030)
pH: 5.5 (ref 5.0–8.0)

## 2015-08-07 MED ORDER — OMEPRAZOLE 20 MG PO CPDR: 20.0000 mg | DELAYED_RELEASE_CAPSULE | Freq: Two times a day (BID) | ORAL | Status: DC

## 2015-08-07 MED ORDER — TERCONAZOLE 0.4 % VA CREA: 1.0000 | TOPICAL_CREAM | Freq: Every day | VAGINAL | Status: DC

## 2015-08-07 MED ORDER — FLUCONAZOLE 100 MG PO TABS: 100.0000 mg | ORAL_TABLET | Freq: Once | ORAL | Status: DC

## 2015-08-07 NOTE — Discharge Instructions (Signed)

## 2015-08-07 NOTE — MAU Provider Note (Signed)
Chief Complaint:  No chief complaint on file.   First Provider Initiated Contact with Patient 08/07/15 1735      HPI: Kerry Jimenez is a 20 y.o. G1P0000 at 46w5dwho presents to maternity admissions sent from the office for evaluation of labor.  She reported cramping/contractions x 2-3 days, worsening today.  She was 1 cm in the office today before coming to MAU.  She reports not drinking enough fluids today. Nothing seems to make her contractions better or worse, she has tried rest and position change only.  She reports good fetal movement, denies LOF, vaginal bleeding, vaginal itching/burning, urinary symptoms, h/a, dizziness, n/v, or fever/chills.    HPI  Past Medical History: Past Medical History  Diagnosis Date  . Medical history non-contributory   . Anemia     Past obstetric history: OB History  Gravida Para Term Preterm AB SAB TAB Ectopic Multiple Living     # Outcome Date GA Lbr Len/2nd Weight Sex Delivery Anes PTL Lv  1 Current               Past Surgical History: Past Surgical History  Procedure Laterality Date  . No past surgeries      Family History: Family History  Problem Relation Age of Onset  . Hypertension    . Diabetes    . Cancer Paternal Grandmother     Social History: Social History  Substance Use Topics  . Smoking status: Never Smoker   . Smokeless tobacco: Never Used  . Alcohol Use: No    Allergies: No Known Allergies  Meds:  Prescriptions prior to admission  Medication Sig Dispense Refill Last Dose  . calcium carbonate (TUMS - DOSED IN MG ELEMENTAL CALCIUM) 500 MG chewable tablet Chew 2 tablets by mouth 4 (four) times daily as needed for indigestion or heartburn.   08/07/2015 at Unknown time  . Prenat-FePoly-Metf-FA-DHA-DSS (VITAFOL FE+) 90-1-200 & 50 MG CPPK Take 2 tablets by mouth daily. 60 each 12 Past Week at Unknown time  . omeprazole (PRILOSEC) 20 MG capsule Take 1 capsule (20 mg total) by mouth 2 (two) times  daily before a meal. 60 capsule 6 Has not started    ROS:  Review of Systems  Constitutional: Negative for fever, chills and fatigue.  Eyes: Negative for visual disturbance.  Respiratory: Negative for shortness of breath.   Cardiovascular: Negative for chest pain.  Gastrointestinal: Positive for abdominal pain. Negative for nausea and vomiting.  Genitourinary: Positive for pelvic pain. Negative for dysuria, flank pain, vaginal bleeding, vaginal discharge, difficulty urinating and vaginal pain.  Neurological: Negative for dizziness and headaches.  Psychiatric/Behavioral: Negative.      I have reviewed patient's Past Medical Hx, Surgical Hx, Family Hx, Social Hx, medications and allergies.   Physical Exam  Patient Vitals for the past 24 hrs:  BP Temp Temp src Pulse Resp  08/07/15 1800 123/71 mmHg - - 89 -  08/07/15 1644 132/66 mmHg 97.9 F (36.6 C) Oral 78 18   Constitutional: Well-developed, well-nourished female in no acute distress.  Cardiovascular: normal rate Respiratory: normal effort GI: Abd soft, non-tender, gravid appropriate for gestational age.  MS: Extremities nontender, no edema, normal ROM Neurologic: Alert and oriented x 4.  GU: Neg CVAT.  PELVIC EXAM: Cervix pink, visually closed, without lesion, scant white creamy discharge, vaginal walls and external genitalia normal Bimanual exam: Cervix 0/long/high, firm, anterior, neg CMT, uterus nontender, nonenlarged, adnexa without tenderness, enlargement, or  mass  Dilation: 1 Effacement (%): 50 Cervical Position: Posterior Station: -3 Presentation: Vertex Exam by:: Sharen Counter, CNM  FHT:  Baseline 135 , moderate variability, accelerations present, no decelerations Contractions: q 2-7 mins, irregular, mild to palpation   Labs: Results for orders placed or performed during the hospital encounter of 08/07/15 (from the past 24 hour(s))  Urinalysis, Routine w reflex microscopic (not at [facility])     Status:  Abnormal   Collection Time: 08/07/15  4:30 PM  Result Value Ref Range   Color, Urine YELLOW YELLOW   APPearance CLEAR CLEAR   Specific Gravity, Urine 1.010 1.005 - 1.030   pH 5.5 5.0 - 8.0   Glucose, UA NEGATIVE NEGATIVE mg/dL   Hgb urine dipstick NEGATIVE NEGATIVE   Bilirubin Urine NEGATIVE NEGATIVE   Ketones, ur NEGATIVE NEGATIVE mg/dL   Protein, ur NEGATIVE NEGATIVE mg/dL   Nitrite NEGATIVE NEGATIVE   Leukocytes, UA SMALL (A) NEGATIVE  Urine microscopic-add on     Status: Abnormal   Collection Time: 08/07/15  4:30 PM  Result Value Ref Range   Squamous Epithelial / LPF 0-5 (A) NONE SEEN   WBC, UA 0-5 0 - 5 WBC/hpf   RBC / HPF 0-5 0 - 5 RBC/hpf   Bacteria, UA RARE (A) NONE SEEN   O/POS/-- (08/26 1536)  Imaging:  No results found.  MAU Course/MDM: No signs of preterm labor with no cervical change over >2 hours.  Pt able to tolerate pitcher of water while in MAU.  Encouraged to continue PO hydration at home.  Pt stable at time of discharge.  Assessment: 1. Threatened preterm labor, antepartum     Plan: Discharge home Labor precautions and fetal kick counts  Follow-up Information    Follow up with HARPER,CHARLES A, MD.   Specialty:  Obstetrics and Gynecology   Why:  As scheduled, Return to MAU as needed for emergencies   Contact information:   [address] [place] [address] [phone]        Medication List    TAKE these medications        calcium carbonate 500 MG chewable tablet  Commonly known as:  TUMS - dosed in mg elemental calcium  Chew 2 tablets by mouth 4 (four) times daily as needed for indigestion or heartburn.     fluconazole 100 MG tablet  Commonly known as:  DIFLUCAN  Take 1 tablet (100 mg total) by mouth once. Repeat dose in 48-72 hour.     omeprazole 20 MG capsule  Commonly known as:  PRILOSEC  Take 1 capsule (20 mg total) by mouth 2 (two) times daily before a meal.     terconazole 0.4 % vaginal cream  Commonly  known as:  TERAZOL 7  Place 1 applicator vaginally at bedtime.     VITAFOL FE+ 90-1-200 & 50 MG Cppk  Take 2 tablets by mouth daily.        Sharen Counter Certified Nurse-Midwife 08/07/2015 6:02 PM

## 2015-08-07 NOTE — Addendum Note (Signed)
Addended by: Samantha Crimes on: 08/07/2015 05:00 PM   Modules accepted: Orders

## 2015-08-07 NOTE — MAU Note (Signed)
Urine in lab 

## 2015-08-07 NOTE — MAU Note (Addendum)
Pt. States she has been contracting for a month. States they are stronger at night. Saw OB provider today. Was 1cm in office. Here for evaluation of contractions. Denies LOF or bleeding. Denies discharge. Baby is moving well. Next OB appointment is next Wednesday. Last intercourse was 08/05/15.

## 2015-08-07 NOTE — Progress Notes (Signed)
Subjective:    Kerry Jimenez is a 20 y.o. female being seen today for her obstetrical visit. She is at [redacted]w[redacted]d gestation. Patient reports backache, no bleeding, no leaking and occasional contractions. Fetal movement: normal.  States that she is not drinking much water.  Is having heartburn, tums is not helping with now, would like medication for it.  Is feeling the contractions.  Visited MAU on Sunday, was given procardia and sent home.    Problem List Items Addressed This Visit    None    Visit Diagnoses    Encounter for supervision of normal first pregnancy in third trimester    -  Primary    Relevant Orders    POCT urinalysis dipstick    Gastroesophageal reflux disease without esophagitis        Relevant Medications    omeprazole (PRILOSEC) 20 MG capsule      Patient Active Problem List   Diagnosis Date Noted  . Lumbar back pain 07/02/2015  . Supervision of normal first pregnancy 02/08/2015  . BV (bacterial vaginosis) 12/06/2012  . Condyloma acuminatum 12/06/2012   Objective:    BP 137/87 mmHg  Pulse 91  Temp(Src) 98.7 F (37.1 C)  Wt 142 lb (64.411 kg)  LMP 11/14/2014 (Exact Date) FHT:  140 BPM  Uterine Size: size equals dates  Presentation: cephalic   Cervix: closed, thinning, posterior.  Ballotable fetal head. Reactive NST:  cat. 1 tracing, + accels, no decels, moderate variability.  Contractions every 1-2 minutes.    Assessment:    Pregnancy @ [redacted]w[redacted]d weeks   Preterm contractions most likely r/t dehydration  GERD  Reactive NST  Plan:    Sent to MAU for preterm contractions, pain management and fluids.     labs reviewed, problem list updated Consent signed. GBS results reviewed TDAP offered  Rhogam given for RH negative Pediatrician: discussed. Infant feeding: plans to breastfeed. Maternity leave: discussed. Cigarette smoking: never smoked. Orders Placed This Encounter  Procedures  . POCT urinalysis dipstick   Meds ordered this encounter  Medications   . omeprazole (PRILOSEC) 20 MG capsule    Sig: Take 1 capsule (20 mg total) by mouth 2 (two) times daily before a meal.    Dispense:  60 capsule    Refill:  6   Follow up in 1 Week.

## 2015-08-08 ENCOUNTER — Inpatient Hospital Stay (HOSPITAL_COMMUNITY)
Admission: AD | Admit: 2015-08-08 | Discharge: 2015-08-09 | Disposition: A | Discharge status: Home / Self Care | Payer: BLUE CROSS/BLUE SHIELD | Source: Ambulatory Visit | Attending: Obstetrics | Admitting: Obstetrics

## 2015-08-08 ENCOUNTER — Encounter (HOSPITAL_COMMUNITY): Payer: Self-pay | Admitting: *Deleted

## 2015-08-08 DIAGNOSIS — Z3493 Encounter for supervision of normal pregnancy, unspecified, third trimester: Secondary | ICD-10-CM | POA: Diagnosis present

## 2015-08-08 LAB — URINALYSIS, ROUTINE W REFLEX MICROSCOPIC
Bilirubin Urine: NEGATIVE
Glucose, UA: NEGATIVE mg/dL
HGB URINE DIPSTICK: NEGATIVE
Ketones, ur: NEGATIVE mg/dL
LEUKOCYTES UA: NEGATIVE
Nitrite: NEGATIVE
PROTEIN: NEGATIVE mg/dL
Specific Gravity, Urine: 1.01 (ref 1.005–1.030)
pH: 6 (ref 5.0–8.0)

## 2015-08-08 NOTE — Discharge Instructions (Signed)
Monilial Vaginitis Vaginitis in a soreness, swelling and redness (inflammation) of the vagina and vulva. Monilial vaginitis is not a sexually transmitted infection. CAUSES  Yeast vaginitis is caused by yeast (candida) that is normally found in your vagina. With a yeast infection, the candida has overgrown in number to a point that upsets the chemical balance. SYMPTOMS   White, thick vaginal discharge.  Swelling, itching, redness and irritation of the vagina and possibly the lips of the vagina (vulva).  Burning or painful urination.  Painful intercourse. DIAGNOSIS  Things that may contribute to monilial vaginitis are:  Postmenopausal and virginal states.  Pregnancy.  Infections.  Being tired, sick or stressed, especially if you had monilial vaginitis in the past.  Diabetes. Good control will help lower the chance.  Birth control pills.  Tight fitting garments.  Using bubble bath, feminine sprays, douches or deodorant tampons.  Taking certain medications that kill germs (antibiotics).  Sporadic recurrence can occur if you become ill. TREATMENT  Your caregiver will give you medication.  There are several kinds of anti monilial vaginal creams and suppositories specific for monilial vaginitis. For recurrent yeast infections, use a suppository or cream in the vagina 2 times a week, or as directed.  Anti-monilial or steroid cream for the itching or irritation of the vulva may also be used. Get your caregiver's permission.  Painting the vagina with methylene blue solution may help if the monilial cream does not work.  Eating yogurt may help prevent monilial vaginitis. HOME CARE INSTRUCTIONS   Finish all medication as prescribed.  Do not have sex until treatment is completed or after your caregiver tells you it is okay.  Take warm sitz baths.  Do not douche.  Do not use tampons, especially scented ones.  Wear cotton underwear.  Avoid tight pants and panty  hose.  Tell your sexual partner that you have a yeast infection. They should go to their caregiver if they have symptoms such as mild rash or itching.  Your sexual partner should be treated as well if your infection is difficult to eliminate.  Practice safer sex. Use condoms.  Some vaginal medications cause latex condoms to fail. Vaginal medications that harm condoms are:  Cleocin cream.  Butoconazole (Femstat).  Terconazole (Terazol) vaginal suppository.  Miconazole (Monistat) (may be purchased over the counter). SEEK MEDICAL CARE IF:   You have a temperature by mouth above 102 F (38.9 C).  The infection is getting worse after 2 days of treatment.  The infection is not getting better after 3 days of treatment.  You develop blisters in or around your vagina.  You develop vaginal bleeding, and it is not your menstrual period.  You have pain when you urinate.  You develop intestinal problems.  You have pain with sexual intercourse.   This information is not intended to replace advice given to you by your health care provider. Make sure you discuss any questions you have with your health care provider.   Document Released: 03/11/2005 Document Revised: 08/24/2011 Document Reviewed: 12/03/2014 Elsevier Interactive Patient Education 2016 [employer] Inc[employer] of the uterus can occur throughout pregnancy. Contractions are not always a sign that you are in labor.  WHAT ARE BRAXTON HICKS CONTRACTIONS?  Contractions that occur before labor are called Braxton Hicks contractions, or false labor. Toward the end of pregnancy (32-34 weeks), these contractions can develop more often and may become more forceful. This is not true labor because these contractions do not result in opening (dilatation)  and thinning of the cervix. They are sometimes difficult to tell apart from true labor because these contractions can be forceful and people have different  pain tolerances. You should not feel embarrassed if you go to the hospital with false labor. Sometimes, the only way to tell if you are in true labor is for your health care provider to look for changes in the cervix. If there are no prenatal problems or other health problems associated with the pregnancy, it is completely safe to be sent home with false labor and await the onset of true labor. HOW CAN YOU TELL THE DIFFERENCE BETWEEN TRUE AND FALSE LABOR? False Labor  The contractions of false labor are usually shorter and not as hard as those of true labor.   The contractions are usually irregular.   The contractions are often felt in the front of the lower abdomen and in the groin.   The contractions may go away when you walk around or change positions while lying down.   The contractions get weaker and are shorter lasting as time goes on.   The contractions do not usually become progressively stronger, regular, and closer together as with true labor.  True Labor  Contractions in true labor last 30-70 seconds, become very regular, usually become more intense, and increase in frequency.   The contractions do not go away with walking.   The discomfort is usually felt in the top of the uterus and spreads to the lower abdomen and low back.   True labor can be determined by your health care provider with an exam. This will show that the cervix is dilating and getting thinner.  WHAT TO REMEMBER  Keep up with your usual exercises and follow other instructions given by your health care provider.   Take medicines as directed by your health care provider.   Keep your regular prenatal appointments.   Eat and drink lightly if you think you are going into labor.   If Braxton Hicks contractions are making you uncomfortable:   Change your position from lying down or resting to walking, or from walking to resting.   Sit and rest in a tub of warm water.   Drink 2-3 glasses  of water. Dehydration may cause these contractions.   Do slow and deep breathing several times an hour.  WHEN SHOULD I SEEK IMMEDIATE MEDICAL CARE? Seek immediate medical care if:  Your contractions become stronger, more regular, and closer together.   You have fluid leaking or gushing from your vagina.   You have a fever.   You pass blood-tinged mucus.   You have vaginal bleeding.   You have continuous abdominal pain.   You have low back pain that you never had before.   You feel your baby's head pushing down and causing pelvic pressure.   Your baby is not moving as much as it used to.    This information is not intended to replace advice given to you by your health care provider. Make sure you discuss any questions you have with your health care provider.   Document Released: 06/01/2005 Document Revised: 06/06/2013 Document Reviewed: 03/13/2013 Elsevier Interactive Patient Education 2016 Else[employer] Inc[employer] Fetal Movement Counts Patient Name: __________________________________________________ Patient Due Date: ____________________ Performing a fetal movement count is highly recommended in high-risk pregnancies, but it is good for every pregnant woman to do. Your health care provider may ask you to start counting fetal movements at 28 weeks of the pregnancy. Fetal movements often increase:  After eating a full meal.  After physical activity.  After eating or drinking something sweet or cold.  At rest. Pay attention to when you feel the baby is most active. This will help you notice a pattern of your baby's sleep and wake cycles and what factors contribute to an increase in fetal movement. It is important to perform a fetal movement count at the same time each day when your baby is normally most active.  HOW TO COUNT FETAL MOVEMENTS  Find a quiet and comfortable area to sit or lie down on your left side. Lying on your left side provides the best blood and oxygen  circulation to your baby.  Write down the day and time on a sheet of paper or in a journal.  Start counting kicks, flutters, swishes, rolls, or jabs in a 2-hour period. You should feel at least 10 movements within 2 hours.  If you do not feel 10 movements in 2 hours, wait 2-3 hours and count again. Look for a change in the pattern or not enough counts in 2 hours. SEEK MEDICAL CARE IF:  You feel less than 10 counts in 2 hours, tried twice.  There is no movement in over an hour.  The pattern is changing or taking longer each day to reach 10 counts in 2 hours.  You feel the baby is not moving as he or she usually does. Date: ____________ Movements: ____________ Start time: ____________ Kerry Jimenez time: ____________  Date: ____________ Movements: ____________ Start time: ____________ Kerry Jimenez time: ____________ Date: ____________ Movements: ____________ Start time: ____________ Kerry Jimenez time: ____________ Date: ____________ Movements: ____________ Start time: ____________ Kerry Jimenez time: ____________ Date: ____________ Movements: ____________ Start time: ____________ Kerry Jimenez time: ____________ Date: ____________ Movements: ____________ Start time: ____________ Kerry Jimenez time: ____________ Date: ____________ Movements: ____________ Start time: ____________ Kerry Jimenez time: ____________ Date: ____________ Movements: ____________ Start time: ____________ Kerry Jimenez time: ____________  Date: ____________ Movements: ____________ Start time: ____________ Kerry Jimenez time: ____________ Date: ____________ Movements: ____________ Start time: ____________ Kerry Jimenez time: ____________ Date: ____________ Movements: ____________ Start time: ____________ Kerry Jimenez time: ____________ Date: ____________ Movements: ____________ Start time: ____________ Kerry Jimenez time: ____________ Date: ____________ Movements: ____________ Start time: ____________ Kerry Jimenez time: ____________ Date: ____________ Movements: ____________ Start time: ____________  Kerry Jimenez time: ____________ Date: ____________ Movements: ____________ Start time: ____________ Kerry Jimenez time: ____________  Date: ____________ Movements: ____________ Start time: ____________ Kerry Jimenez time: ____________ Date: ____________ Movements: ____________ Start time: ____________ Kerry Jimenez time: ____________ Date: ____________ Movements: ____________ Start time: ____________ Kerry Jimenez time: ____________ Date: ____________ Movements: ____________ Start time: ____________ Kerry Jimenez time: ____________ Date: ____________ Movements: ____________ Start time: ____________ Kerry Jimenez time: ____________ Date: ____________ Movements: ____________ Start time: ____________ Kerry Jimenez time: ____________ Date: ____________ Movements: ____________ Start time: ____________ Kerry Jimenez time: ____________  Date: ____________ Movements: ____________ Start time: ____________ Kerry Jimenez time: ____________ Date: ____________ Movements: ____________ Start time: ____________ Kerry Jimenez time: ____________ Date: ____________ Movements: ____________ Start time: ____________ Kerry Jimenez time: ____________ Date: ____________ Movements: ____________ Start time: ____________ Kerry Jimenez time: ____________ Date: ____________ Movements: ____________ Start time: ____________ Kerry Jimenez time: ____________ Date: ____________ Movements: ____________ Start time: ____________ Kerry Jimenez time: ____________ Date: ____________ Movements: ____________ Start time: ____________ Kerry Jimenez time: ____________  Date: ____________ Movements: ____________ Start time: ____________ Kerry Jimenez time: ____________ Date: ____________ Movements: ____________ Start time: ____________ Kerry Jimenez time: ____________ Date: ____________ Movements: ____________ Start time: ____________ Kerry Jimenez time: ____________ Date: ____________ Movements: ____________ Start time: ____________ Kerry Jimenez time: ____________ Date: ____________ Movements: ____________ Start time: ____________ Kerry Jimenez time: ____________ Date: ____________  Movements: ____________ Start time: ____________ Kerry Jimenez time: ____________ Date:  ____________ Movements: ____________ Start time: ____________ Kerry Jimenez time: ____________  Date: ____________ Movements: ____________ Start time: ____________ Kerry Jimenez time: ____________ Date: ____________ Movements: ____________ Start time: ____________ Kerry Jimenez time: ____________ Date: ____________ Movements: ____________ Start time: ____________ Kerry Jimenez time: ____________ Date: ____________ Movements: ____________ Start time: ____________ Kerry Jimenez time: ____________ Date: ____________ Movements: ____________ Start time: ____________ Kerry Jimenez time: ____________ Date: ____________ Movements: ____________ Start time: ____________ Kerry Jimenez time: ____________ Date: ____________ Movements: ____________ Start time: ____________ Kerry Jimenez time: ____________  Date: ____________ Movements: ____________ Start time: ____________ Kerry Jimenez time: ____________ Date: ____________ Movements: ____________ Start time: ____________ Kerry Jimenez time: ____________ Date: ____________ Movements: ____________ Start time: ____________ Kerry Jimenez time: ____________ Date: ____________ Movements: ____________ Start time: ____________ Kerry Jimenez time: ____________ Date: ____________ Movements: ____________ Start time: ____________ Kerry Jimenez time: ____________ Date: ____________ Movements: ____________ Start time: ____________ Kerry Jimenez time: ____________ Date: ____________ Movements: ____________ Start time: ____________ Kerry Jimenez time: ____________  Date: ____________ Movements: ____________ Start time: ____________ Kerry Jimenez time: ____________ Date: ____________ Movements: ____________ Start time: ____________ Kerry Jimenez time: ____________ Date: ____________ Movements: ____________ Start time: ____________ Kerry Jimenez time: ____________ Date: ____________ Movements: ____________ Start time: ____________ Kerry Jimenez time: ____________ Date: ____________ Movements: ____________ Start time:  ____________ Kerry Jimenez time: ____________ Date: ____________ Movements: ____________ Start time: ____________ Kerry Jimenez time: ____________   This information is not intended to replace advice given to you by your health care provider. Make sure you discuss any questions you have with your health care provider.   Document Released: 07/01/2006 Document Revised: 06/22/2014 Document Reviewed: 03/28/2012 Elsevier Interactive Patient Education [employer]

## 2015-08-08 NOTE — MAU Note (Signed)
Patient did not want percocet. Patient ready for discharge.

## 2015-08-08 NOTE — MAU Note (Signed)
PT SAYS SHE  FEELS TIGHTNESS IN ABD -  STARTED   AT 10PM.  PNC-   WITH  DR HARPER.       VE IN OFFICE  ON WED-    1  CM.    DENIES HSV AND MRSA. GBS-NEG

## 2015-08-08 NOTE — MAU Note (Signed)
Notified Dr. Clearance Coots that patient presents with cramping on exam 1.5/-2/70-80. Not ctx on the monitor. Provider said patient can be given a percocet then discharged.

## 2015-08-09 DIAGNOSIS — Z3493 Encounter for supervision of normal pregnancy, unspecified, third trimester: Secondary | ICD-10-CM | POA: Diagnosis not present

## 2015-08-14 ENCOUNTER — Ambulatory Visit (INDEPENDENT_AMBULATORY_CARE_PROVIDER_SITE_OTHER): Payer: Medicaid Other | Admitting: Certified Nurse Midwife

## 2015-08-14 VITALS — BP 131/58 | HR 99 | Temp 98.4°F | Wt 145.0 lb

## 2015-08-14 DIAGNOSIS — O368990 Maternal care for other specified fetal problems, unspecified trimester, not applicable or unspecified: Secondary | ICD-10-CM | POA: Diagnosis not present

## 2015-08-14 DIAGNOSIS — O332 Maternal care for disproportion due to inlet contraction of pelvis: Secondary | ICD-10-CM | POA: Diagnosis not present

## 2015-08-14 DIAGNOSIS — Z3403 Encounter for supervision of normal first pregnancy, third trimester: Secondary | ICD-10-CM

## 2015-08-14 DIAGNOSIS — K5901 Slow transit constipation: Secondary | ICD-10-CM

## 2015-08-14 LAB — POCT URINALYSIS DIPSTICK
Bilirubin, UA: NEGATIVE
GLUCOSE UA: NEGATIVE
Ketones, UA: NEGATIVE
Leukocytes, UA: NEGATIVE
NITRITE UA: NEGATIVE
RBC UA: NEGATIVE
Spec Grav, UA: 1.025
UROBILINOGEN UA: NEGATIVE
pH, UA: 5

## 2015-08-14 MED ORDER — NIFEDIPINE 10 MG PO CAPS: 10.0000 mg | ORAL_CAPSULE | Freq: Once | ORAL | Status: DC

## 2015-08-14 MED ORDER — POLYETHYLENE GLYCOL 3350 17 GM/SCOOP PO POWD: 17.0000 g | Freq: Once | ORAL | Status: DC

## 2015-08-14 MED ORDER — OXYCODONE-ACETAMINOPHEN 5-325 MG PO TABS: 1.0000 | ORAL_TABLET | ORAL | Status: DC | PRN

## 2015-08-14 NOTE — Progress Notes (Signed)
Subjective:    Kerry Jimenez is a 20 y.o. female being seen today for her obstetrical visit. She is at [redacted]w[redacted]d gestation. Patient reports contractions since yesterday evening about 9X/hour. Fetal movement: normal.  Problem List Items Addressed This Visit    None    Visit Diagnoses    Encounter for supervision of normal first pregnancy in third trimester    -  Primary    Relevant Orders    POCT urinalysis dipstick (Completed)    Inlet contraction of pelvis in pregnancy        Relevant Medications    NIFEdipine (PROCARDIA) 10 MG capsule    oxyCODONE-acetaminophen (PERCOCET) 5-325 MG tablet    Slow transit constipation        Relevant Medications    polyethylene glycol powder (GLYCOLAX/MIRALAX) powder      Patient Active Problem List   Diagnosis Date Noted  . Lumbar back pain 07/02/2015  . Supervision of normal first pregnancy 02/08/2015  . BV (bacterial vaginosis) 12/06/2012  . Condyloma acuminatum 12/06/2012    Objective:    BP 131/58 mmHg  Pulse 99  Temp(Src) 98.4 F (36.9 C)  Wt 145 lb (65.772 kg)  LMP 11/14/2014 (Exact Date) FHT: 145 BPM  Uterine Size: size equals dates  Presentations: cephalic  Pelvic Exam:              Dilation: Closed       Effacement: 25%             Station:  -3    Consistency: soft            Position: posterior   NST: + accels, no decels, + moderate variability.  Cat. 1 tracing.  Contractions every 4 minutes.    Assessment:    Pregnancy @ [redacted]w[redacted]d weeks   Reactive NST  Term contractions: braxton hicks  Consitpation  Plan:   Plans for delivery: Vaginal anticipated; labs reviewed; problem list updated Counseling: Consent signed. Infant feeding: plans to breastfeed. Cigarette smoking: never smoked. L&D discussion: symptoms of labor, discussed when to call, discussed what number to call, anesthetic/analgesic options reviewed and delivering clinician:  plans Certified Nurse-Midwife. Postpartum supports and preparation: circumcision  discussed and contraception plans discussed.  Follow up in 1 Week.

## 2015-08-15 ENCOUNTER — Other Ambulatory Visit (INDEPENDENT_AMBULATORY_CARE_PROVIDER_SITE_OTHER): Payer: BLUE CROSS/BLUE SHIELD

## 2015-08-15 ENCOUNTER — Telehealth: Payer: Self-pay | Admitting: *Deleted

## 2015-08-15 VITALS — BP 132/87 | HR 94

## 2015-08-15 DIAGNOSIS — O368990 Maternal care for other specified fetal problems, unspecified trimester, not applicable or unspecified: Secondary | ICD-10-CM

## 2015-08-15 DIAGNOSIS — Z3403 Encounter for supervision of normal first pregnancy, third trimester: Secondary | ICD-10-CM

## 2015-08-15 LAB — POCT URINALYSIS DIPSTICK
BILIRUBIN UA: NEGATIVE
GLUCOSE UA: NEGATIVE
KETONES UA: NEGATIVE
Nitrite, UA: NEGATIVE
Protein, UA: NEGATIVE
RBC UA: NEGATIVE
UROBILINOGEN UA: NEGATIVE
pH, UA: 7

## 2015-08-15 NOTE — Progress Notes (Unsigned)
Patient in office for a blood pressure check per Orvilla Cornwall, CNM. Patient contacted the office stating she was having swelling in her feet and could feel pressure and tightness in her feet due to the tightness. Patient had been elevating her feet at the time of the call but had not seen much improvement at that time.   BP 132/87 mmHg  Pulse 94  LMP 11/14/2014 (Exact Date)

## 2015-08-21 ENCOUNTER — Ambulatory Visit (INDEPENDENT_AMBULATORY_CARE_PROVIDER_SITE_OTHER): Payer: BLUE CROSS/BLUE SHIELD | Admitting: Certified Nurse Midwife

## 2015-08-21 VITALS — BP 131/94 | HR 91 | Temp 98.6°F | Wt 144.0 lb

## 2015-08-21 DIAGNOSIS — Z3403 Encounter for supervision of normal first pregnancy, third trimester: Secondary | ICD-10-CM

## 2015-08-21 LAB — POCT URINALYSIS DIPSTICK
BILIRUBIN UA: NEGATIVE
Glucose, UA: NEGATIVE
KETONES UA: NEGATIVE
LEUKOCYTES UA: NEGATIVE
NITRITE UA: NEGATIVE
PH UA: 7
Protein, UA: NEGATIVE
RBC UA: NEGATIVE
Spec Grav, UA: 1.015
UROBILINOGEN UA: NEGATIVE

## 2015-08-22 LAB — CBC
HCT: 31.2 % — ABNORMAL LOW (ref 36.0–46.0)
Hemoglobin: 9.9 g/dL — ABNORMAL LOW (ref 12.0–15.0)
MCH: 26 pg (ref 26.0–34.0)
MCHC: 31.7 g/dL (ref 30.0–36.0)
MCV: 81.9 fL (ref 78.0–100.0)
MPV: 11.3 fL (ref 8.6–12.4)
PLATELETS: 175 10*3/uL (ref 150–400)
RBC: 3.81 MIL/uL — AB (ref 3.87–5.11)
RDW: 15.7 % — AB (ref 11.5–15.5)
WBC: 5.9 10*3/uL (ref 4.0–10.5)

## 2015-08-22 LAB — AST: AST: 14 U/L (ref 10–30)

## 2015-08-22 LAB — PATHOLOGIST SMEAR REVIEW

## 2015-08-22 LAB — PROTEIN / CREATININE RATIO, URINE
Creatinine, Urine: 295 mg/dL (ref 20–320)
Protein Creatinine Ratio: 105 mg/g creat (ref 21–161)
TOTAL PROTEIN, URINE: 31 mg/dL — AB (ref 5–24)

## 2015-08-22 LAB — ALT: ALT: 10 U/L (ref 6–29)

## 2015-08-22 LAB — LACTATE DEHYDROGENASE: LDH: 163 U/L (ref 94–250)

## 2015-08-22 LAB — CREATININE, SERUM: CREATININE: 0.67 mg/dL (ref 0.50–1.10)

## 2015-08-22 NOTE — Progress Notes (Signed)
Subjective:    Kerry Jimenez is a 20 y.o. female being seen today for her obstetrical visit. She is at 4257w6d gestation. Patient reports no bleeding, no cramping, no leaking, occasional contractions and vaginal irritation. Fetal movement: normal.  Problem List Items Addressed This Visit    None    Visit Diagnoses    Encounter for supervision of normal first pregnancy in third trimester    -  Primary    Relevant Orders    POCT urinalysis dipstick (Completed)    US MFM OB LIMITED    AMB referral to maternal fetal medicine    Creatinine clearance, urine, 24 hour    Protein, urine, 24 hour    Lactate dehydrogenase    Lactate dehydrogenase (Completed)    Creatinine, serum (Completed)    CBC (Completed)    ALT (Completed)    AST (Completed)    Protein / creatinine ratio, urine (Completed)    Pathologist smear review    SureSwab, Vaginosis/Vaginitis Plus      Patient Active Problem List   Diagnosis Date Noted  . Lumbar back pain 07/02/2015  . Supervision of normal first pregnancy 02/08/2015  . BV (bacterial vaginosis) 12/06/2012  . Condyloma acuminatum 12/06/2012    Objective:    BP 131/94 mmHg  Pulse 91  Temp(Src) 98.6 F (37 C)  Wt 144 lb (65.318 kg)  LMP 11/14/2014 (Exact Date) FHT: 145 BPM  Uterine Size: size equals dates  Presentations: cephalic  Pelvic Exam:              Dilation: 1cm       Effacement: Long             Station:  -3    Consistency: soft            Position: posterior, left side and curves anteriorly     Assessment:    Pregnancy @ 7757w6d weeks   Elevated blood pressures  Plan:    Discussed case with Dr. Adonis HousekeeperJ. Zina Jimenez in MFM if elevated b/p Friday then schedule an induction for delivery.  Plans for delivery: Vaginal anticipated; labs reviewed; problem list updated Counseling: Consent signed. Infant feeding: plans to breastfeed. Cigarette smoking: never smoked. L&D discussion: symptoms of labor, discussed when to call, discussed what number to  call, anesthetic/analgesic options reviewed and delivering clinician:  plans Certified Nurse-Midwife. Postpartum supports and preparation: circumcision discussed and contraception plans discussed.  Follow up in 1 Week.

## 2015-08-23 ENCOUNTER — Other Ambulatory Visit: Payer: Self-pay | Admitting: Certified Nurse Midwife

## 2015-08-23 ENCOUNTER — Ambulatory Visit: Payer: BLUE CROSS/BLUE SHIELD | Admitting: *Deleted

## 2015-08-23 VITALS — BP 133/82 | HR 86

## 2015-08-23 DIAGNOSIS — Z3403 Encounter for supervision of normal first pregnancy, third trimester: Secondary | ICD-10-CM

## 2015-08-23 NOTE — Addendum Note (Signed)
Addended by: Burnell BlanksMASHBURN, Terralyn Matsumura on: 08/23/2015 10:02 AM   Modules accepted: Orders

## 2015-08-25 ENCOUNTER — Inpatient Hospital Stay (HOSPITAL_COMMUNITY): Payer: BLUE CROSS/BLUE SHIELD | Admitting: Anesthesiology

## 2015-08-25 ENCOUNTER — Inpatient Hospital Stay (HOSPITAL_COMMUNITY)
Admission: AD | Admit: 2015-08-25 | Discharge: 2015-08-28 | DRG: 775 | Disposition: A | Discharge status: Home / Self Care | Payer: BLUE CROSS/BLUE SHIELD | Source: Ambulatory Visit | Attending: Obstetrics | Admitting: Obstetrics

## 2015-08-25 ENCOUNTER — Encounter (HOSPITAL_COMMUNITY): Payer: Self-pay

## 2015-08-25 DIAGNOSIS — Z3A39 39 weeks gestation of pregnancy: Secondary | ICD-10-CM

## 2015-08-25 DIAGNOSIS — O1404 Mild to moderate pre-eclampsia, complicating childbirth: Principal | ICD-10-CM | POA: Diagnosis present

## 2015-08-25 DIAGNOSIS — O1490 Unspecified pre-eclampsia, unspecified trimester: Secondary | ICD-10-CM | POA: Diagnosis present

## 2015-08-25 DIAGNOSIS — O9081 Anemia of the puerperium: Secondary | ICD-10-CM | POA: Diagnosis not present

## 2015-08-25 DIAGNOSIS — D649 Anemia, unspecified: Secondary | ICD-10-CM | POA: Diagnosis not present

## 2015-08-25 LAB — CBC
HCT: 31.1 % — ABNORMAL LOW (ref 36.0–46.0)
HEMATOCRIT: 28.8 % — AB (ref 36.0–46.0)
Hemoglobin: 9.9 g/dL — ABNORMAL LOW (ref 12.0–15.0)
Hemoglobin: 9.4 g/dL — ABNORMAL LOW (ref 12.0–15.0)
MCH: 25.6 pg — AB (ref 26.0–34.0)
MCH: 26.2 pg (ref 26.0–34.0)
MCHC: 31.8 g/dL (ref 30.0–36.0)
MCHC: 32.6 g/dL (ref 30.0–36.0)
MCV: 80.6 fL (ref 78.0–100.0)
MCV: 80.2 fL (ref 78.0–100.0)
PLATELETS: 164 10*3/uL (ref 150–400)
Platelets: 155 10*3/uL (ref 150–400)
RBC: 3.86 MIL/uL — ABNORMAL LOW (ref 3.87–5.11)
RBC: 3.59 MIL/uL — ABNORMAL LOW (ref 3.87–5.11)
RDW: 15.2 % (ref 11.5–15.5)
RDW: 15 % (ref 11.5–15.5)
WBC: 6.5 10*3/uL (ref 4.0–10.5)
WBC: 7.4 10*3/uL (ref 4.0–10.5)

## 2015-08-25 LAB — TYPE AND SCREEN
ABO/RH(D): O POS
Antibody Screen: NEGATIVE

## 2015-08-25 LAB — SURESWAB, VAGINOSIS/VAGINITIS PLUS
ATOPOBIUM VAGINAE: NOT DETECTED Log (cells/mL)
C. GLABRATA, DNA: NOT DETECTED
C. albicans, DNA: NOT DETECTED
C. parapsilosis, DNA: NOT DETECTED
C. trachomatis RNA, TMA: NOT DETECTED
C. tropicalis, DNA: NOT DETECTED
LACTOBACILLUS SPECIES: NOT DETECTED Log (cells/mL)
MEGASPHAERA SPECIES: NOT DETECTED Log (cells/mL)
N. gonorrhoeae RNA, TMA: NOT DETECTED
T. vaginalis RNA, QL TMA: NOT DETECTED

## 2015-08-25 LAB — RPR: RPR Ser Ql: NONREACTIVE

## 2015-08-25 MED ORDER — EPHEDRINE 5 MG/ML INJ
10.0000 mg | INTRAVENOUS | Status: DC | PRN
  Filled 2015-08-25: qty 2

## 2015-08-25 MED ORDER — NALBUPHINE HCL 10 MG/ML IJ SOLN
10.0000 mg | Freq: Four times a day (QID) | INTRAMUSCULAR | Status: DC | PRN
  Administered 2015-08-25 (×2): 10 mg via INTRAVENOUS
  Filled 2015-08-25 (×2): qty 1

## 2015-08-25 MED ORDER — CITRIC ACID-SODIUM CITRATE 334-500 MG/5ML PO SOLN: 30.0000 mL | ORAL | Status: DC | PRN

## 2015-08-25 MED ORDER — PHENYLEPHRINE 40 MCG/ML (10ML) SYRINGE FOR IV PUSH (FOR BLOOD PRESSURE SUPPORT)
80.0000 ug | PREFILLED_SYRINGE | INTRAVENOUS | Status: DC | PRN
  Filled 2015-08-25: qty 2
  Filled 2015-08-25: qty 20

## 2015-08-25 MED ORDER — FENTANYL CITRATE (PF) 100 MCG/2ML IJ SOLN
100.0000 ug | INTRAMUSCULAR | Status: DC | PRN
  Administered 2015-08-25 (×2): 100 ug via INTRAVENOUS
  Filled 2015-08-25 (×2): qty 2

## 2015-08-25 MED ORDER — FENTANYL 2.5 MCG/ML BUPIVACAINE 1/10 % EPIDURAL INFUSION (WH - ANES)
14.0000 mL/h | INTRAMUSCULAR | Status: DC | PRN
Start: 2015-08-25 — End: 2015-08-26
  Administered 2015-08-25 – 2015-08-26 (×2): 12 mL/h via EPIDURAL
  Filled 2015-08-25 (×2): qty 125

## 2015-08-25 MED ORDER — LACTATED RINGERS IV SOLN: 500.0000 mL | Freq: Once | INTRAVENOUS | Status: DC

## 2015-08-25 MED ORDER — LIDOCAINE HCL (PF) 1 % IJ SOLN
INTRAMUSCULAR | Status: DC | PRN
  Administered 2015-08-25: 5 mL via EPIDURAL
  Administered 2015-08-25: 3 mL via EPIDURAL
  Administered 2015-08-25: 2 mL via EPIDURAL

## 2015-08-25 MED ORDER — TERBUTALINE SULFATE 1 MG/ML IJ SOLN: 0.2500 mg | Freq: Once | INTRAMUSCULAR | Status: DC | PRN

## 2015-08-25 MED ORDER — LACTATED RINGERS IV SOLN
500.0000 mL | INTRAVENOUS | Status: DC | PRN
  Administered 2015-08-26: 500 mL via INTRAVENOUS

## 2015-08-25 MED ORDER — OXYCODONE-ACETAMINOPHEN 5-325 MG PO TABS: 2.0000 | ORAL_TABLET | ORAL | Status: DC | PRN

## 2015-08-25 MED ORDER — OXYTOCIN BOLUS FROM INFUSION
500.0000 mL | INTRAVENOUS | Status: DC
  Administered 2015-08-26: 500 mL via INTRAVENOUS

## 2015-08-25 MED ORDER — LABETALOL HCL 200 MG PO TABS
200.0000 mg | ORAL_TABLET | Freq: Three times a day (TID) | ORAL | Status: DC
  Administered 2015-08-25 (×2): 200 mg via ORAL
  Filled 2015-08-25 (×5): qty 1

## 2015-08-25 MED ORDER — ONDANSETRON HCL 4 MG/2ML IJ SOLN: 4.0000 mg | Freq: Four times a day (QID) | INTRAMUSCULAR | Status: DC | PRN

## 2015-08-25 MED ORDER — LIDOCAINE HCL (PF) 1 % IJ SOLN
30.0000 mL | INTRAMUSCULAR | Status: DC | PRN
  Filled 2015-08-25: qty 30

## 2015-08-25 MED ORDER — MISOPROSTOL 50MCG HALF TABLET
50.0000 ug | ORAL_TABLET | ORAL | Status: DC | PRN
  Administered 2015-08-25: 50 ug via ORAL
  Filled 2015-08-25: qty 0.5

## 2015-08-25 MED ORDER — LACTATED RINGERS IV SOLN
INTRAVENOUS | Status: DC
  Administered 2015-08-25 – 2015-08-26 (×3): via INTRAVENOUS

## 2015-08-25 MED ORDER — ACETAMINOPHEN 325 MG PO TABS: 650.0000 mg | ORAL_TABLET | ORAL | Status: DC | PRN

## 2015-08-25 MED ORDER — PHENYLEPHRINE 40 MCG/ML (10ML) SYRINGE FOR IV PUSH (FOR BLOOD PRESSURE SUPPORT): 80.0000 ug | PREFILLED_SYRINGE | INTRAVENOUS | Status: DC | PRN

## 2015-08-25 MED ORDER — DIPHENHYDRAMINE HCL 50 MG/ML IJ SOLN
12.5000 mg | INTRAMUSCULAR | Status: DC | PRN
  Administered 2015-08-26: 12.5 mg via INTRAVENOUS
  Filled 2015-08-25: qty 1

## 2015-08-25 MED ORDER — LACTATED RINGERS IV SOLN
2.5000 [IU]/h | INTRAVENOUS | Status: DC
  Administered 2015-08-26: 2.5 [IU]/h via INTRAVENOUS
  Filled 2015-08-25: qty 10

## 2015-08-25 MED ORDER — NALBUPHINE HCL 10 MG/ML IJ SOLN
10.0000 mg | Freq: Four times a day (QID) | INTRAMUSCULAR | Status: DC | PRN
  Administered 2015-08-25: 10 mg via INTRAMUSCULAR
  Filled 2015-08-25: qty 1

## 2015-08-25 MED ORDER — PROMETHAZINE HCL 25 MG/ML IJ SOLN
12.5000 mg | Freq: Four times a day (QID) | INTRAMUSCULAR | Status: DC | PRN
  Administered 2015-08-25: 12.5 mg via INTRAVENOUS
  Filled 2015-08-25: qty 1

## 2015-08-25 MED ORDER — OXYCODONE-ACETAMINOPHEN 5-325 MG PO TABS: 1.0000 | ORAL_TABLET | ORAL | Status: DC | PRN

## 2015-08-25 NOTE — H&P (Signed)
Kerry Jimenez is a 20 y.o. female presenting for  Contractions, vaginal bleeding, preeclampsia at term.  Per Dr. Marjo Bickerenney on 03/8016 if patients diastolic was >90 on 2 occasions admit and induce labor.   History OB History    Gravida Para Term Preterm AB TAB SAB Ectopic Multiple Living   1 0 0 0 0 0 0 0 0 0      Past Medical History  Diagnosis Date  . Medical history non-contributory   . Anemia    Past Surgical History  Procedure Laterality Date  . No past surgeries     Family History: family history includes Cancer in her paternal grandmother. Social History:  reports that she has never smoked. She has never used smokeless tobacco. She reports that she does not drink alcohol or use illicit drugs.   Prenatal Transfer Tool  Maternal Diabetes: No Genetic Screening: Normal Maternal Ultrasounds/Referrals: Normal Fetal Ultrasounds or other Referrals:  None Maternal Substance Abuse:  No Significant Maternal Medications:  None Significant Maternal Lab Results:  None Other Comments:  Preeclampsia 24 hour urine pending as of Friday March 10th, 2017.  ROS  Dilation: 1.5 Effacement (%): 50 Station: Ballotable Exam by:: Michelle,RN  Blood pressure 124/69, pulse 72, temperature 98.4 F (36.9 C), temperature source Oral, resp. rate 18, height 4\' 11"  (1.499 m), weight 145 lb (65.772 kg), last menstrual period 11/14/2014, SpO2 100 %. Exam Physical Exam  Prenatal labs: ABO, Rh: --/--/O POS (03/12 0900) Antibody: NEG (03/12 0900) Rubella: 6.16 (08/26 1536) RPR: Non Reactive (03/12 0900)  HBsAg: NEGATIVE (08/26 1536)  HIV: NONREACTIVE (12/21 1125)  GBS: NOT DETECTED (02/15 1711)   Assessment/Plan: Admit for preeclampsia.  Cytotec.  IV pain medications until 3-4 cm then epidural.     Roe Coombsachelle A Fynn Vanblarcom, CNM 08/25/2015, 3:45 PM

## 2015-08-25 NOTE — Progress Notes (Signed)
R Denney CNM notified of pt's VE, BP's , contraction pattern, orders received to admit pt

## 2015-08-25 NOTE — Anesthesia Procedure Notes (Signed)
Epidural Patient location during procedure: OB  Staffing Anesthesiologist: TURK, STEPHEN EDWARD Performed by: anesthesiologist   Preanesthetic Checklist Completed: patient identified, pre-op evaluation, timeout performed, IV checked, risks and benefits discussed and monitors and equipment checked  Epidural Patient position: sitting Prep: DuraPrep Patient monitoring: blood pressure and continuous pulse ox Approach: midline Location: L3-L4 Injection technique: LOR air  Needle:  Needle type: Tuohy  Needle gauge: 17 G Needle length: 9 cm Needle insertion depth: 4 cm Catheter size: 19 Gauge Catheter at skin depth: 9 cm Test dose: negative and Other (1% Lidocaine)  Additional Notes Patient identified.  Risk benefits discussed including failed block, incomplete pain control, headache, nerve damage, paralysis, blood pressure changes, nausea, vomiting, reactions to medication both toxic or allergic, and postpartum back pain.  Patient expressed understanding and wished to proceed.  All questions were answered.  Sterile technique used throughout procedure and epidural site dressed with sterile barrier dressing. No paresthesia or other complications noted. The patient did not experience any signs of intravascular injection such as tinnitus or metallic taste in mouth nor signs of intrathecal spread such as rapid motor block. Please see nursing notes for vital signs. Reason for block:procedure for pain   

## 2015-08-25 NOTE — Anesthesia Preprocedure Evaluation (Addendum)
Anesthesia Evaluation  Patient identified by MRN, date of birth, ID band Patient awake    Reviewed: Allergy & Precautions, NPO status , Patient's Chart, lab work & pertinent test results  Airway Mallampati: II  TM Distance: >3 FB Neck ROM: Full    Dental  (+) Teeth Intact, Dental Advisory Given   Pulmonary neg pulmonary ROS,    Pulmonary exam normal breath sounds clear to auscultation       Cardiovascular negative cardio ROS Normal cardiovascular exam Rhythm:Regular Rate:Normal     Neuro/Psych negative neurological ROS  negative psych ROS   GI/Hepatic negative GI ROS, Neg liver ROS,   Endo/Other  negative endocrine ROS  Renal/GU negative Renal ROS     Musculoskeletal negative musculoskeletal ROS (+)   Abdominal   Peds  Hematology  (+) Blood dyscrasia, anemia , Plt 155k    Anesthesia Other Findings Day of surgery medications reviewed with the patient.  Reproductive/Obstetrics (+) Pregnancy Pre-eclampsia                             Anesthesia Physical Anesthesia Plan  ASA: III  Anesthesia Plan: Epidural   Post-op Pain Management:    Induction:   Airway Management Planned:   Additional Equipment:   Intra-op Plan:   Post-operative Plan:   Informed Consent: I have reviewed the patients History and Physical, chart, labs and discussed the procedure including the risks, benefits and alternatives for the proposed anesthesia with the patient or authorized representative who has indicated his/her understanding and acceptance.   Dental advisory given  Plan Discussed with:   Anesthesia Plan Comments: (Patient identified. Risks/Benefits/Options discussed with patient including but not limited to bleeding, infection, nerve damage, paralysis, failed block, incomplete pain control, headache, blood pressure changes, nausea, vomiting, reactions to medication both or allergic, itching and  postpartum back pain. Confirmed with bedside nurse the patient's most recent platelet count. Confirmed with patient that they are not currently taking any anticoagulation, have any bleeding history or any family history of bleeding disorders. Patient expressed understanding and wished to proceed. All questions were answered. )        Anesthesia Quick Evaluation

## 2015-08-25 NOTE — MAU Note (Signed)
Vaginal bleeding at 0546, on tissue when wiped then like spotting. Increased mucus for 5 days.  Contractions irregular tonight, every 20 min then in an hour.  Baby moving well.  No leaking fluid.  Was monitoring for blood pressure at office, was high and went back down, drew bloodwork.  Turned in a 24 hour urine and waiting for results.

## 2015-08-26 ENCOUNTER — Encounter (HOSPITAL_COMMUNITY): Payer: Self-pay

## 2015-08-26 DIAGNOSIS — O1490 Unspecified pre-eclampsia, unspecified trimester: Secondary | ICD-10-CM | POA: Diagnosis present

## 2015-08-26 LAB — CBC
HEMATOCRIT: 24.8 % — AB (ref 36.0–46.0)
Hemoglobin: 8.2 g/dL — ABNORMAL LOW (ref 12.0–15.0)
MCH: 26.5 pg (ref 26.0–34.0)
MCHC: 33.1 g/dL (ref 30.0–36.0)
MCV: 80.3 fL (ref 78.0–100.0)
Platelets: 129 10*3/uL — ABNORMAL LOW (ref 150–400)
RBC: 3.09 MIL/uL — ABNORMAL LOW (ref 3.87–5.11)
RDW: 15.2 % (ref 11.5–15.5)
WBC: 8.9 10*3/uL (ref 4.0–10.5)

## 2015-08-26 LAB — PROTEIN, URINE, 24 HOUR
PROTEIN 24H UR: 126 mg/(24.h) (ref <150)
PROTEIN, URINE: 18 mg/dL (ref 5–24)

## 2015-08-26 LAB — CREATININE CLEARANCE, URINE, 24 HOUR
CREATININE, URINE: 168 mg/dL (ref 20–320)
Creatinine Clearance: 122 mL/min — ABNORMAL HIGH (ref 75–115)
Creatinine, 24H Ur: 1.18 g/(24.h) (ref 0.63–2.50)
Creatinine: 0.67 mg/dL (ref 0.50–1.10)

## 2015-08-26 MED ORDER — FLEET ENEMA 7-19 GM/118ML RE ENEM: 1.0000 | ENEMA | Freq: Every day | RECTAL | Status: DC | PRN

## 2015-08-26 MED ORDER — IBUPROFEN 600 MG PO TABS
600.0000 mg | ORAL_TABLET | Freq: Four times a day (QID) | ORAL | Status: DC
  Administered 2015-08-26 – 2015-08-28 (×8): 600 mg via ORAL
  Filled 2015-08-26 (×8): qty 1

## 2015-08-26 MED ORDER — ONDANSETRON HCL 4 MG/2ML IJ SOLN: 4.0000 mg | INTRAMUSCULAR | Status: DC | PRN

## 2015-08-26 MED ORDER — DIBUCAINE 1 % RE OINT: 1.0000 "application " | TOPICAL_OINTMENT | RECTAL | Status: DC | PRN

## 2015-08-26 MED ORDER — ONDANSETRON HCL 4 MG PO TABS: 4.0000 mg | ORAL_TABLET | ORAL | Status: DC | PRN

## 2015-08-26 MED ORDER — SIMETHICONE 80 MG PO CHEW: 80.0000 mg | CHEWABLE_TABLET | ORAL | Status: DC | PRN

## 2015-08-26 MED ORDER — MEASLES, MUMPS & RUBELLA VAC ~~LOC~~ INJ
0.5000 mL | INJECTION | Freq: Once | SUBCUTANEOUS | Status: DC
  Filled 2015-08-26: qty 0.5

## 2015-08-26 MED ORDER — OXYCODONE-ACETAMINOPHEN 5-325 MG PO TABS: 2.0000 | ORAL_TABLET | ORAL | Status: DC | PRN

## 2015-08-26 MED ORDER — PRENATAL MULTIVITAMIN CH
1.0000 | ORAL_TABLET | Freq: Every day | ORAL | Status: DC
  Administered 2015-08-26 – 2015-08-27 (×2): 1 via ORAL
  Filled 2015-08-26 (×2): qty 1

## 2015-08-26 MED ORDER — MISOPROSTOL 200 MCG PO TABS
1000.0000 ug | ORAL_TABLET | ORAL | Status: AC
  Administered 2015-08-26: 1000 ug via RECTAL

## 2015-08-26 MED ORDER — ZOLPIDEM TARTRATE 5 MG PO TABS: 5.0000 mg | ORAL_TABLET | Freq: Every evening | ORAL | Status: DC | PRN

## 2015-08-26 MED ORDER — OXYTOCIN 10 UNIT/ML IJ SOLN
1.0000 m[IU]/min | INTRAVENOUS | Status: DC
  Administered 2015-08-26: 2 m[IU]/min via INTRAVENOUS

## 2015-08-26 MED ORDER — MEDROXYPROGESTERONE ACETATE 150 MG/ML IM SUSP: 150.0000 mg | INTRAMUSCULAR | Status: DC | PRN

## 2015-08-26 MED ORDER — ACETAMINOPHEN 325 MG PO TABS: 650.0000 mg | ORAL_TABLET | ORAL | Status: DC | PRN

## 2015-08-26 MED ORDER — BENZOCAINE-MENTHOL 20-0.5 % EX AERO
1.0000 "application " | INHALATION_SPRAY | CUTANEOUS | Status: DC | PRN
  Administered 2015-08-26 – 2015-08-28 (×3): 1 via TOPICAL
  Filled 2015-08-26 (×3): qty 56

## 2015-08-26 MED ORDER — OXYTOCIN 10 UNIT/ML IJ SOLN: 2.5000 [IU]/h | INTRAVENOUS | Status: AC

## 2015-08-26 MED ORDER — OXYCODONE-ACETAMINOPHEN 5-325 MG PO TABS
1.0000 | ORAL_TABLET | ORAL | Status: DC | PRN
  Administered 2015-08-26 – 2015-08-27 (×3): 1 via ORAL
  Filled 2015-08-26 (×3): qty 1

## 2015-08-26 MED ORDER — LANOLIN HYDROUS EX OINT: TOPICAL_OINTMENT | CUTANEOUS | Status: DC | PRN

## 2015-08-26 MED ORDER — MISOPROSTOL 200 MCG PO TABS
ORAL_TABLET | ORAL | Status: AC
  Administered 2015-08-26: 1000 ug via RECTAL
  Filled 2015-08-26: qty 5

## 2015-08-26 MED ORDER — SENNOSIDES-DOCUSATE SODIUM 8.6-50 MG PO TABS
2.0000 | ORAL_TABLET | ORAL | Status: DC
  Administered 2015-08-26: 2 via ORAL
  Filled 2015-08-26 (×3): qty 2

## 2015-08-26 MED ORDER — METHYLERGONOVINE MALEATE 0.2 MG PO TABS: 0.2000 mg | ORAL_TABLET | ORAL | Status: DC | PRN

## 2015-08-26 MED ORDER — BISACODYL 10 MG RE SUPP: 10.0000 mg | Freq: Every day | RECTAL | Status: DC | PRN

## 2015-08-26 MED ORDER — DIPHENHYDRAMINE HCL 25 MG PO CAPS: 25.0000 mg | ORAL_CAPSULE | Freq: Four times a day (QID) | ORAL | Status: DC | PRN

## 2015-08-26 MED ORDER — METHYLERGONOVINE MALEATE 0.2 MG/ML IJ SOLN: 0.2000 mg | INTRAMUSCULAR | Status: DC | PRN

## 2015-08-26 MED ORDER — TETANUS-DIPHTH-ACELL PERTUSSIS 5-2.5-18.5 LF-MCG/0.5 IM SUSP: 0.5000 mL | Freq: Once | INTRAMUSCULAR | Status: DC

## 2015-08-26 MED ORDER — WITCH HAZEL-GLYCERIN EX PADS: 1.0000 "application " | MEDICATED_PAD | CUTANEOUS | Status: DC | PRN

## 2015-08-26 MED ORDER — TERBUTALINE SULFATE 1 MG/ML IJ SOLN: 0.2500 mg | Freq: Once | INTRAMUSCULAR | Status: DC | PRN

## 2015-08-26 MED ORDER — FERROUS SULFATE 325 (65 FE) MG PO TABS
325.0000 mg | ORAL_TABLET | Freq: Two times a day (BID) | ORAL | Status: DC
  Administered 2015-08-26 – 2015-08-28 (×4): 325 mg via ORAL
  Filled 2015-08-26 (×4): qty 1

## 2015-08-26 MED ORDER — VITAFOL FE+ 90-1-200 & 50 MG PO CPPK: 2.0000 | ORAL_CAPSULE | Freq: Every day | ORAL | Status: DC

## 2015-08-26 NOTE — Anesthesia Postprocedure Evaluation (Signed)
Anesthesia Post Note  Patient: Kerry Jimenez  Procedure(s) Performed: * No procedures listed *  Patient location during evaluation: Mother Baby Anesthesia Type: Epidural Level of consciousness: awake and alert Pain management: pain level controlled Vital Signs Assessment: post-procedure vital signs reviewed and stable Respiratory status: spontaneous breathing, nonlabored ventilation and respiratory function stable Cardiovascular status: stable Postop Assessment: no headache, no backache and epidural receding Anesthetic complications: no Comments: Current pain 0, pain goal 10    Last Vitals:  Filed Vitals:   08/26/15 1108 08/26/15 1109  BP: 117/73   Pulse: 117 103  Temp:    Resp: 18     Last Pain:  Filed Vitals:   08/26/15 1109  PainSc: 0-No pain                 Jataya Wann

## 2015-08-26 NOTE — Progress Notes (Signed)
Notified [name], CNM of elevated temp. Instructed to give patient some percocet, since her pain was increased as well, and to notify when rechecked if it has not changed. Encouraged patient to increase fluid intake as well. Earl Galasborne, Linda HedgesStefanie RochesterHudspeth

## 2015-08-26 NOTE — Lactation Note (Signed)
This note was copied from a baby's chart. Lactation Consultation Note  Patient Name: Kerry Jimenez ZOXWR'UToday's Date: 08/26/2015 Reason for consult: Initial assessment Baby at 8 hr of life and mom is worried that baby is sleeping too much. She does not think that she is making any milk yet. Demonstrated manual expression, colostrum noted bilaterally, spoon in room. Attempted latch even though baby was not cuing. Mom was able to position the baby well in cross cradle. Discussed baby behavior, feeding frequency, baby belly size, voids, wt loss, breast changes, and nipple care. Given lactation handouts. Aware of OP services and support group.     Maternal Data Has patient been taught Hand Expression?: Yes Does the patient have breastfeeding experience prior to this delivery?: No  Feeding Feeding Type: Breast Fed Length of feed: 0 min  LATCH Score/Interventions Latch: Too sleepy or reluctant, no latch achieved, no sucking elicited. Intervention(s): Skin to skin;Teach feeding cues;Waking techniques  Audible Swallowing: None Intervention(s): Skin to skin;Hand expression Intervention(s): Alternate breast massage  Type of Nipple: Everted at rest and after stimulation  Comfort (Breast/Nipple): Soft / non-tender     Hold (Positioning): Full assist, staff holds infant at breast Intervention(s): Support Pillows;Position options  LATCH Score: 4  Lactation Tools Discussed/Used WIC Program: Yes   Consult Status Consult Status: Follow-up Date: 08/27/15 Follow-up type: In-patient    [name] 08/26/2015, 5:45 PM

## 2015-08-27 LAB — CBC
HEMATOCRIT: 23.2 % — AB (ref 36.0–46.0)
Hemoglobin: 7.7 g/dL — ABNORMAL LOW (ref 12.0–15.0)
MCH: 26.5 pg (ref 26.0–34.0)
MCHC: 33.2 g/dL (ref 30.0–36.0)
MCV: 79.7 fL (ref 78.0–100.0)
PLATELETS: 143 10*3/uL — AB (ref 150–400)
RBC: 2.91 MIL/uL — AB (ref 3.87–5.11)
RDW: 15.4 % (ref 11.5–15.5)
WBC: 8.9 10*3/uL (ref 4.0–10.5)

## 2015-08-27 NOTE — Lactation Note (Signed)
This note was copied from a baby's chart. Lactation Consultation Note: Mom reports baby has been nursing well.Is sleepy and spitty at present. Would not latch Left skin to skin with mom. Encouraged to call for assist when baby wakes up. No questions at present.   Patient Name: Kerry Jimenez'UToday's Date: 08/27/2015 Reason for consult: Follow-up assessment   Maternal Data Formula Feeding for Exclusion: Yes Reason for exclusion: Mother's choice to formula and breast feed on admission Does the patient have breastfeeding experience prior to this delivery?: No  Feeding Feeding Type: Breast Fed Length of feed: 15 min  LATCH Score/Interventions Latch: Too sleepy or reluctant, no latch achieved, no sucking elicited. Intervention(s): Skin to skin  Audible Swallowing: None  Type of Nipple: Flat  Comfort (Breast/Nipple): Soft / non-tender     Hold (Positioning): Assistance needed to correctly position infant at breast and maintain latch.  LATCH Score: 4  Lactation Tools Discussed/Used     Consult Status Consult Status: Follow-up Date: 08/27/15 Follow-up type: In-patient    Pamelia HoitWeeks, Fredda Clarida D 08/27/2015, 10:11 AM

## 2015-08-27 NOTE — Progress Notes (Signed)
Post Partum Day #1 Subjective: no complaints, up ad lib, voiding and tolerating PO  Objective: Blood pressure 128/81, pulse 77, temperature 98.6 F (37 C), temperature source Oral, resp. rate 18, height $RemoveBef oreDEID_[id number]$4\' 11"ual period 11/14/2014, SpO2 100 %, unknown if currently breastfeeding.  Physical Exam:  General: alert, cooperative and no distress Lochia: appropriate Uterine Fundus: firm Incision: none DVT Evaluation: No evidence of DVT seen on physical exam. Negative Homan's sign. No cords or calf tenderness. No significant calf/ankle edema.   Recent Labs  08/26/15 1041 08/27/15 0526  HGB 8.2* 7.7*  HCT 24.8* 23.2*    Assessment/Plan: Plan for discharge tomorrow, Breastfeeding and Lactation consult  Anemia: stable, iron started.    LOS: 2 days   Roe CoombsRachelle A Calvyn Kurtzman, CNM 08/27/2015, 10:19 AM

## 2015-08-27 NOTE — Lactation Note (Signed)
This note was copied from a baby's chart. Lactation Consultation Note; Baby awake and dad changing diaper as I went in. Assisted mom with football hold- she has been using cradle hold. Reviewed basic teaching- wide open mouth and keeping the baby close tot he breast throughout the feeding. Encouragement given. Reviewed [facility] and OP appointments as resources for support after DC> No questions at present. To call for assist prn  Patient Name: Kerry Jimenez: 08/27/2015 Reason for consult: Follow-up assessment   Maternal Data Formula Feeding for Exclusion: Yes Reason for exclusion: Mother's choice to formula and breast feed on admission Does the patient have breastfeeding experience prior to this delivery?: No  Feeding Feeding Type: Breast Fed Length of feed: 25 min  LATCH Score/Interventions Latch: Grasps breast easily, tongue down, lips flanged, rhythmical sucking. Intervention(s): Skin to skin  Audible Swallowing: A few with stimulation  Type of Nipple: Everted at rest and after stimulation  Comfort (Breast/Nipple): Soft / non-tender     Hold (Positioning): Assistance needed to correctly position infant at breast and maintain latch. Intervention(s): Breastfeeding basics reviewed  LATCH Score: 8  Lactation Tools Discussed/Used     Consult Status Consult Status: Follow-up Jimenez: 08/28/15 Follow-up type: In-patient    Pamelia HoitWeeks, Maan Zarcone D 08/27/2015, 10:57 AM

## 2015-08-28 ENCOUNTER — Encounter: Payer: BLUE CROSS/BLUE SHIELD | Admitting: Certified Nurse Midwife

## 2015-08-28 MED ORDER — FUSION PLUS PO CAPS: 1.0000 | ORAL_CAPSULE | Freq: Every day | ORAL | Status: DC

## 2015-08-28 MED ORDER — OXYCODONE-ACETAMINOPHEN 5-325 MG PO TABS: 1.0000 | ORAL_TABLET | ORAL | Status: DC | PRN

## 2015-08-28 MED ORDER — IBUPROFEN 600 MG PO TABS: 600.0000 mg | ORAL_TABLET | Freq: Four times a day (QID) | ORAL | Status: DC

## 2015-08-28 MED ORDER — SENNOSIDES-DOCUSATE SODIUM 8.6-50 MG PO TABS: 2.0000 | ORAL_TABLET | Freq: Two times a day (BID) | ORAL | Status: DC

## 2015-08-28 MED ORDER — LANOLIN HYDROUS EX OINT: 1.0000 "application " | TOPICAL_OINTMENT | CUTANEOUS | Status: DC | PRN

## 2015-08-28 MED ORDER — BENZOCAINE-MENTHOL 20-0.5 % EX AERO: 1.0000 "application " | INHALATION_SPRAY | Freq: Four times a day (QID) | CUTANEOUS | Status: DC | PRN

## 2015-08-28 NOTE — Progress Notes (Signed)
Post Partum Day #2 Subjective: no complaints, up ad lib, voiding and tolerating PO  Objective: Blood pressure 119/78, pulse 75, temperature 98.4 F (36.9 C), temperature source Oral, resp. rate 18, height 4\' 11"  (1.499 m), weight 145 lb (65.772 kg), last menstrual period 11/14/2014, SpO2 100 %, unknown if currently breastfeeding.  Physical Exam:  General: alert, cooperative and no distress Lochia: appropriate Uterine Fundus: firm Incision: none DVT Evaluation: No evidence of DVT seen on physical exam. No cords or calf tenderness. No significant calf/ankle edema.   Recent Labs  08/26/15 1041 08/27/15 0526  HGB 8.2* 7.7*  HCT 24.8* 23.2*    Assessment/Plan: Discharge home and Breastfeeding   LOS: 3 days   Rachelle A Denney 08/28/2015, 8:23 AM

## 2015-08-28 NOTE — Discharge Summary (Signed)
Obstetric Discharge Summary Reason for Admission: onset of labor and augmentation for preeclampsia Prenatal Procedures: ultrasound Intrapartum Procedures: spontaneous vaginal delivery Postpartum Procedures: none Complications-Operative and Postpartum: none HEMOGLOBIN  Date Value Ref Range Status  08/27/2015 7.7* 12.0 - 15.0 g/dL Final   HCT  Date Value Ref Range Status  08/27/2015 23.2* 36.0 - 46.0 % Final    Physical Exam:  General: alert, cooperative and no distress Lochia: appropriate Uterine Fundus: firm Incision: none DVT Evaluation: No evidence of DVT seen on physical exam. No cords or calf tenderness. No significant calf/ankle edema.  Discharge Diagnoses: Term Pregnancy-delivered and mild preeclampsia  Discharge Information: Date: 08/28/2015 Activity: pelvic rest Diet: routine Medications: PNV, Ibuprofen, Colace, Iron and Percocet Condition: stable Instructions: refer to practice specific booklet Discharge to: home   Newborn Data: Live born female  Birth Weight: 7 lb 0.3 oz (3184 g) APGAR: 8, 10  Home with mother.  Roe Coombsachelle A Denney, CNM 08/28/2015, 8:33 AM

## 2015-08-28 NOTE — Lactation Note (Signed)
This note was copied from a baby's chart. Lactation Consultation Note  Baby latched in cradle positon upon entering.  Mother states baby has been cluster feeding. Praised her for her efforts. Mother denies soreness or problems. Encouraged her to compress/massage breast during feeding to keep baby active. Mother has manual hand pump.  Discussed stomach size and supply and demand. Mom encouraged to feed baby 8-12 times/24 hours and with feeding cues.  Reviewed engorgement care and monitoring voids/stools.   Patient Name: Girl Ny Milus Mallickjeria Knaggs ZOXWR'UToday's Date: 08/28/2015 Reason for consult: Follow-up assessment   Maternal Data    Feeding Feeding Type: Breast Fed Length of feed: 30 min  LATCH Score/Interventions Latch: Grasps breast easily, tongue down, lips flanged, rhythmical sucking. (latched upon entering)  Audible Swallowing: A few with stimulation Intervention(s): Skin to skin  Type of Nipple: Everted at rest and after stimulation  Comfort (Breast/Nipple): Soft / non-tender     Hold (Positioning): No assistance needed to correctly position infant at breast.  LATCH Score: 9  Lactation Tools Discussed/Used     Consult Status Consult Status: Complete    Hardie PulleyBerkelhammer, Ruth Boschen 08/28/2015, 8:51 AM

## 2015-09-06 NOTE — Progress Notes (Signed)
Patient in the office for a blood pressure check. Per Kerry Jimenez, CNM. Patient okay to keep scheduled appointment for next week.

## 2015-09-06 NOTE — Telephone Encounter (Signed)
Error

## 2015-09-11 ENCOUNTER — Encounter: Payer: Self-pay | Admitting: Certified Nurse Midwife

## 2015-09-11 ENCOUNTER — Ambulatory Visit (INDEPENDENT_AMBULATORY_CARE_PROVIDER_SITE_OTHER): Payer: BLUE CROSS/BLUE SHIELD | Admitting: Certified Nurse Midwife

## 2015-09-11 NOTE — Progress Notes (Signed)
Patient ID: Kerry Jimenez, female   DOB: [birth year], 20 y.o.   MRN: [id number]  Subjective:     Kerry Jimenez is a 20 y.o. female who presents for a postpartum visit. She is 2 weeks postpartum following a spontaneous vaginal delivery. I have fully reviewed the prenatal and intrapartum course. The delivery was at 39 gestational weeks. Outcome: spontaneous vaginal delivery. Anesthesia: epidural. Postpartum course has been normal. Baby's course has been normal. Baby is feeding by breast. Bleeding thin lochia and brown. Bowel function is normal. Bladder function is normal. Patient is not sexually active. Contraception method is abstinence. Postpartum depression screening: negative.  Tobacco, alcohol and substance abuse history reviewed.  Adult immunizations reviewed including TDAP, rubella and varicella.  The following portions of the patient's history were reviewed and updated as appropriate: allergies, current medications, past family history, past medical history, past social history, past surgical history and problem list.  Review of Systems Pertinent items noted in HPI and remainder of comprehensive ROS otherwise negative.   Objective:    BP 132/84 mmHg  Pulse 113  Temp(Src) 98.9 F (37.2 C)  Wt 119 lb (53.978 kg)  Breastfeeding? Yes  General:  alert, cooperative and no distress   Breasts:  inspection negative, no nipple discharge or bleeding, no masses or nodularity palpable  Lungs: clear to auscultation bilaterally  Heart:  regular rate and rhythm, S1, S2 normal, no murmur, click, rub or gallop  Abdomen: soft, non-tender; bowel sounds normal; no masses,  no organomegaly   Vulva:  not evaluated  Vagina: not evaluated  Cervix:  not examined  Corpus: normal  Adnexa:  not evaluated  Rectal Exam: Not performed.          50% of 15 min visit spent on counseling and coordination of care.  Assessment:     Normal 2 week postpartum exam. Pap smear not done at today's visit.  Plan:    1. Contraception: abstinence 2.  Planning IUD in 4 weeks for [place] 3. Follow up in: 4 weeks or as needed.  2hr GTT for h/o GDM/screening for DM q 3 yrs per ADA recommendations Preconception counseling provided Healthy lifestyle practices reviewed

## 2015-10-09 ENCOUNTER — Encounter: Payer: Self-pay | Admitting: Certified Nurse Midwife

## 2015-10-09 ENCOUNTER — Ambulatory Visit (INDEPENDENT_AMBULATORY_CARE_PROVIDER_SITE_OTHER): Payer: BLUE CROSS/BLUE SHIELD | Admitting: Certified Nurse Midwife

## 2015-10-09 DIAGNOSIS — Z01419 Encounter for gynecological examination (general) (routine) without abnormal findings: Secondary | ICD-10-CM | POA: Diagnosis not present

## 2015-10-09 DIAGNOSIS — Z30013 Encounter for initial prescription of injectable contraceptive: Secondary | ICD-10-CM

## 2015-10-09 MED ORDER — MEDROXYPROGESTERONE ACETATE 150 MG/ML IM SUSP
150.0000 mg | INTRAMUSCULAR | Status: DC
Start: 2015-10-09 — End: 2016-12-01

## 2015-10-10 ENCOUNTER — Ambulatory Visit (INDEPENDENT_AMBULATORY_CARE_PROVIDER_SITE_OTHER): Payer: BLUE CROSS/BLUE SHIELD | Admitting: *Deleted

## 2015-10-10 VITALS — BP 132/91 | HR 74

## 2015-10-10 DIAGNOSIS — Z3042 Encounter for surveillance of injectable contraceptive: Secondary | ICD-10-CM

## 2015-10-10 DIAGNOSIS — Z30013 Encounter for initial prescription of injectable contraceptive: Secondary | ICD-10-CM

## 2015-10-10 MED ORDER — MEDROXYPROGESTERONE ACETATE 150 MG/ML IM SUSP
150.0000 mg | Freq: Once | INTRAMUSCULAR | Status: AC
  Administered 2015-10-10: 150 mg via INTRAMUSCULAR

## 2015-10-10 NOTE — Progress Notes (Signed)
Pt is in office today for depo injection. Pt was seen on 10-09-15 for a PP visit and was approved for depo. Pt has not has intercourse since her delivery.  Pt was given injection, tolerated well.  Pt states that she may not want to continue on depo.  Pt was made aware that if she would like to switch methods to call office and make us aware before next injection is due.  Pt was advised her return date is July 19,2017. Pt states understanding.  Administrations This Visit    medroxyPROGESTERone (DEPO-PROVERA) injection 150 mg    Admin Date Action Dose Route Administered By         10/10/2015 Given 150 mg Intramuscular Lanney GinsSuzanne D Dima Mini, CMA

## 2015-10-10 NOTE — Progress Notes (Signed)
Patient ID: Kerry Jimenez, female   DOB: 10/19/1995, 20 y.o.   MRN: [id number]  Subjective:     Kerry Jimenez is a 20 y.o. female who presents for a postpartum visit. She is 6 weeks postpartum following a spontaneous vaginal delivery. I have fully reviewed the prenatal and intrapartum course. The delivery was at 40 gestational weeks. Outcome: spontaneous vaginal delivery. Anesthesia: epidural. Postpartum course has been normal. Baby's course has been normal. Baby is feeding by breast. Bleeding no bleeding. Bowel function is normal. Bladder function is normal. Patient is not sexually active. Contraception method is abstinence. Postpartum depression screening: negative.  Tobacco, alcohol and substance abuse history reviewed.  Adult immunizations reviewed including TDAP, rubella and varicella.  The following portions of the patient's history were reviewed and updated as appropriate: allergies, current medications, past family history, past medical history, past social history, past surgical history and problem list.  Review of Systems Pertinent items noted in HPI and remainder of comprehensive ROS otherwise negative.   Objective:    BP 128/78 mmHg  Pulse 63  Wt 119 lb (53.978 kg)  General:  alert, cooperative and no distress   Breasts:  inspection negative, no nipple discharge or bleeding, no masses or nodularity palpable  Lungs: clear to auscultation bilaterally  Heart:  regular rate and rhythm, S1, S2 normal, no murmur, click, rub or gallop  Abdomen: soft, non-tender; bowel sounds normal; no masses,  no organomegaly   Vulva:  normal  Vagina: normal vagina  Cervix:  anteverted  Corpus: normal size, contour, position, consistency, mobility, non-tender  Adnexa:  no mass, fullness, tenderness  Rectal Exam: Not performed.       Attempted insertion of IUD, was not able to get passed internal OS, her cervical canal twists towards the top ?retroflexed, anteverted uterus.    50% of 30 min visit  spent on counseling and coordination of care.  Assessment:     normal 6 week postpartum exam. Pap smear done at today's visit.     Contraception counseling   Failed IUD insertion  Plan:    1. Contraception: Depo-Provera injections 2. Follow up in: 1 year or as needed.  2hr GTT for h/o GDM/screening for DM q 3 yrs per ADA recommendations Preconception counseling provided Healthy lifestyle practices reviewed

## 2015-10-11 LAB — NUSWAB VAGINITIS PLUS (VG+)
CANDIDA GLABRATA, NAA: NEGATIVE
Candida albicans, NAA: NEGATIVE
Chlamydia trachomatis, NAA: NEGATIVE
NEISSERIA GONORRHOEAE, NAA: NEGATIVE
TRICH VAG BY NAA: NEGATIVE

## 2015-10-11 LAB — PAP IG W/ RFLX HPV ASCU: PAP SMEAR COMMENT: 0

## 2016-01-01 ENCOUNTER — Ambulatory Visit: Payer: BLUE CROSS/BLUE SHIELD

## 2016-01-07 ENCOUNTER — Ambulatory Visit (INDEPENDENT_AMBULATORY_CARE_PROVIDER_SITE_OTHER): Payer: BLUE CROSS/BLUE SHIELD | Admitting: *Deleted

## 2016-01-07 VITALS — BP 128/89 | HR 67 | Wt 125.0 lb

## 2016-01-07 DIAGNOSIS — Z3042 Encounter for surveillance of injectable contraceptive: Secondary | ICD-10-CM

## 2016-01-07 MED ORDER — MEDROXYPROGESTERONE ACETATE 150 MG/ML IM SUSP
150.0000 mg | INTRAMUSCULAR | Status: AC
  Administered 2016-01-07: 150 mg via INTRAMUSCULAR

## 2016-01-07 NOTE — Progress Notes (Signed)
Pt is in office today for depo injection. Pt is on time for her injection. Pt tolerated injection well. Pt advised to RTO on 03-30-16 for next injection.  Pt has no other concerns today.  BP 128/89   Pulse 67   Wt 125 lb (56.7 kg)   BMI 25.25 kg/m   Administrations This Visit    medroxyPROGESTERone (DEPO-PROVERA) injection 150 mg    Admin Date 01/07/2016 Action Given Dose 150 mg Route Intramuscular Administered By Lanney Gins, CMA

## 2016-03-30 ENCOUNTER — Ambulatory Visit: Payer: BLUE CROSS/BLUE SHIELD

## 2016-04-13 ENCOUNTER — Encounter (HOSPITAL_COMMUNITY): Payer: Self-pay | Admitting: Emergency Medicine

## 2016-04-13 ENCOUNTER — Emergency Department (HOSPITAL_COMMUNITY)
Admission: EM | Admit: 2016-04-13 | Discharge: 2016-04-13 | Disposition: A | Discharge status: Home / Self Care | Payer: BLUE CROSS/BLUE SHIELD | Attending: Physician Assistant | Admitting: Physician Assistant

## 2016-04-13 DIAGNOSIS — N939 Abnormal uterine and vaginal bleeding, unspecified: Secondary | ICD-10-CM | POA: Diagnosis present

## 2016-04-13 DIAGNOSIS — H1013 Acute atopic conjunctivitis, bilateral: Secondary | ICD-10-CM | POA: Diagnosis not present

## 2016-04-13 DIAGNOSIS — Z79899 Other long term (current) drug therapy: Secondary | ICD-10-CM | POA: Insufficient documentation

## 2016-04-13 MED ORDER — CETIRIZINE HCL 10 MG PO TABS: 10.0000 mg | ORAL_TABLET | Freq: Every day | ORAL | 0 refills | Status: DC

## 2016-04-13 MED ORDER — TETRACAINE HCL 0.5 % OP SOLN
1.0000 [drp] | Freq: Once | OPHTHALMIC | Status: AC
  Administered 2016-04-13: 1 [drp] via OPHTHALMIC
  Filled 2016-04-13: qty 4

## 2016-04-13 MED ORDER — FLUORESCEIN SODIUM 1 MG OP STRP
1.0000 | ORAL_STRIP | Freq: Once | OPHTHALMIC | Status: AC
  Administered 2016-04-13: 1 via OPHTHALMIC
  Filled 2016-04-13: qty 1

## 2016-04-13 MED ORDER — ERYTHROMYCIN 5 MG/GM OP OINT
TOPICAL_OINTMENT | Freq: Once | OPHTHALMIC | Status: AC
  Administered 2016-04-13: 23:00:00 via OPHTHALMIC
  Filled 2016-04-13: qty 3.5

## 2016-04-13 NOTE — ED Triage Notes (Signed)
Pt reports bilateral eye redness x2 weeks. Denies pain, itching, drainage, and vision changes. Pt also states she wants to be evaluated for constant heavy vaginal bleeding since receiving the depo shot in June. Denies dizziness and abdominal pain.

## 2016-04-13 NOTE — ED Notes (Signed)
MD at bedside. 

## 2016-04-13 NOTE — Discharge Instructions (Signed)
You were seen with redness in her eyes. This could be allergic conjunctivitis or uveitis. Please use the antibiotics as prescribed and follow-up with ophthalmologist number that was given.  Return as needed.

## 2016-04-13 NOTE — ED Provider Notes (Signed)
[facility]-EMERGENCY DEPT Provider Note   CSN: [id number] Arrival date & time: 04/13/16  1937     History   Chief Complaint Chief Complaint  Patient presents with  . Eye Problem  . Vaginal Bleeding    HPI Kerry Jimenez is a 20 y.o. female.  HPI  Patient is a 20 year old female presenting with eye redness. Patient has mild itchiness but no pain. Questionable whether her she has blurry vision. No discharge. No pain with EOM, No swelling. No contacts.   No UV exposure    Past Medical History:  Diagnosis Date  . Anemia   . Medical history non-contributory     Patient Active Problem List   Diagnosis Date Noted  . Preeclampsia 08/26/2015  . Labor and delivery indication for care or intervention 08/25/2015  . Lumbar back pain 07/02/2015  . Supervision of normal first pregnancy 02/08/2015  . BV (bacterial vaginosis) 12/06/2012  . Condyloma acuminatum 12/06/2012    Past Surgical History:  Procedure Laterality Date  . NO PAST SURGERIES      OB History    Gravida Para Term Preterm AB Living   1 1 1  0 0 1   SAB TAB Ectopic Multiple Live Births   0 0 0 0 1       Home Medications    Prior to Admission medications   Medication Sig Start Date End Date Taking? Authorizing Provider  ibuprofen (ADVIL,MOTRIN) 600 MG tablet Take 1 tablet (600 mg total) by mouth every 6 (six) hours. Patient not taking: Reported on 04/13/2016 08/28/15   Rachelle A Denney, CNM  Iron-FA-B Cmp-C-Biot-Probiotic (FUSION PLUS) CAPS Take 1 tablet by mouth daily. Patient not taking: Reported on 04/13/2016 08/28/15   Roe Coombsachelle A Denney, CNM  medroxyPROGESTERone (DEPO-PROVERA) 150 MG/ML injection Inject 1 mL (150 mg total) into the muscle every 3 (three) months. 10/09/15   Roe Coombsachelle A Denney, CNM  oxyCODONE-acetaminophen (PERCOCET/ROXICET) 5-325 MG tablet Take 1-2 tablets by mouth every 4 (four) hours as needed (pain scale > 7). Patient not taking: Reported on 04/13/2016 08/28/15   Rachelle A Denney, CNM    Prenat-FePoly-Metf-FA-DHA-DSS (VITAFOL FE+) 90-1-200 & 50 MG CPPK Take 2 tablets by mouth daily. Patient not taking: Reported on 04/13/2016 06/18/15   Roe Coombsachelle A Denney, CNM    Family History Family History  Problem Relation Age of Onset  . Hypertension    . Diabetes    . Cancer Paternal Grandmother     Social History Social History  Substance Use Topics  . Smoking status: Never Smoker  . Smokeless tobacco: Never Used  . Alcohol use No     Allergies   Review of patient's allergies indicates no known allergies.   Review of Systems Review of Systems  Constitutional: Negative for fatigue and fever.  Eyes: Positive for redness and itching. Negative for photophobia, pain, discharge and visual disturbance.  All other systems reviewed and are negative.    Physical Exam Updated Vital Signs BP 120/69 (BP Location: Left Arm)   Pulse 73   Temp 98.8 F (37.1 C) (Oral)   Resp 18   Ht 5' (1.524 m)   Wt 120 lb (54.4 kg)   SpO2 100%   BMI 23.44 kg/m   Physical Exam  Constitutional: She is oriented to person, place, and time. She appears well-developed and well-nourished.  HENT:  Head: Normocephalic and atraumatic.  Eyes: EOM are normal. Pupils are equal, round, and reactive to light. Right eye exhibits no discharge.  Injected conjunctiva. No discharge. Pupils  equal round reactive.   Cardiovascular: Normal rate.   Pulmonary/Chest: Effort normal.  Neurological: She is oriented to person, place, and time.  Skin: Skin is warm and dry. She is not diaphoretic.  Psychiatric: She has a normal mood and affect.  Nursing note and vitals reviewed.    ED Treatments / Results  Labs (all labs ordered are listed, but only abnormal results are displayed) Labs Reviewed - No data to display  EKG  EKG Interpretation None       Radiology No results found.  Procedures Procedures (including critical care time)  Medications Ordered in ED Medications  fluorescein ophthalmic  strip 1 strip (not administered)  erythromycin ophthalmic ointment (not administered)  tetracaine (PONTOCAINE) 0.5 % ophthalmic solution 1 drop (not administered)     Initial Impression / Assessment and Plan / ED Course  I have reviewed the triage vital signs and the nursing notes.  Pertinent labs & imaging results that were available during my care of the patient were reviewed by me and considered in my medical decision making (see chart for details).  Clinical Course    Pt here with eye redness bilaterally, no pain. No UV exposure. Doubt acute angle, or any other dangerous eye pathology given normal exam.  Allergic conjunctivitis vs uveitis.   Woods lamp negative.  Will give antihistamine, eyrthro drops and follow up with ophthamology.    Final Clinical Impressions(s) / ED Diagnoses   Final diagnoses:  None    New Prescriptions New Prescriptions   No medications on file     Rajiv Parlato Randall AnLyn Magenta Schmiesing, MD 04/13/16 2228

## 2016-04-13 NOTE — ED Notes (Signed)
Patient was alert, oriented and stable upon discharge. RN went over AVS and patient had no further questions.  

## 2016-04-20 ENCOUNTER — Ambulatory Visit (INDEPENDENT_AMBULATORY_CARE_PROVIDER_SITE_OTHER): Payer: BLUE CROSS/BLUE SHIELD | Admitting: *Deleted

## 2016-04-20 DIAGNOSIS — Z3202 Encounter for pregnancy test, result negative: Secondary | ICD-10-CM | POA: Diagnosis not present

## 2016-04-20 DIAGNOSIS — Z30013 Encounter for initial prescription of injectable contraceptive: Secondary | ICD-10-CM

## 2016-04-20 LAB — POCT URINE PREGNANCY: Preg Test, Ur: NEGATIVE

## 2016-06-23 ENCOUNTER — Ambulatory Visit: Payer: BLUE CROSS/BLUE SHIELD | Admitting: Obstetrics

## 2016-10-25 ENCOUNTER — Other Ambulatory Visit: Payer: Self-pay | Admitting: Certified Nurse Midwife

## 2016-11-03 ENCOUNTER — Emergency Department (HOSPITAL_COMMUNITY)
Admission: EM | Admit: 2016-11-03 | Discharge: 2016-11-04 | Disposition: A | Discharge status: Home / Self Care | Payer: BLUE CROSS/BLUE SHIELD | Attending: Emergency Medicine | Admitting: Emergency Medicine

## 2016-11-03 ENCOUNTER — Encounter (HOSPITAL_COMMUNITY): Payer: Self-pay | Admitting: *Deleted

## 2016-11-03 DIAGNOSIS — Z331 Pregnant state, incidental: Secondary | ICD-10-CM | POA: Insufficient documentation

## 2016-11-03 DIAGNOSIS — H6591 Unspecified nonsuppurative otitis media, right ear: Secondary | ICD-10-CM | POA: Diagnosis not present

## 2016-11-03 DIAGNOSIS — Z79899 Other long term (current) drug therapy: Secondary | ICD-10-CM | POA: Insufficient documentation

## 2016-11-03 DIAGNOSIS — H6690 Otitis media, unspecified, unspecified ear: Secondary | ICD-10-CM

## 2016-11-03 DIAGNOSIS — Z3490 Encounter for supervision of normal pregnancy, unspecified, unspecified trimester: Secondary | ICD-10-CM

## 2016-11-03 DIAGNOSIS — H9201 Otalgia, right ear: Secondary | ICD-10-CM | POA: Diagnosis present

## 2016-11-03 LAB — POC URINE PREG, ED: Preg Test, Ur: POSITIVE — AB

## 2016-11-03 NOTE — ED Triage Notes (Signed)
Pt c/o onset of right ear pain and unable to hear out of the ear since this morning. Pt has taken some tylenol for the same. Pt says she is also unsure if she is pregnant, last period around March.

## 2016-11-03 NOTE — ED Notes (Signed)
PA at bedside.

## 2016-11-04 MED ORDER — AMOXICILLIN 500 MG PO TABS: 500.0000 mg | ORAL_TABLET | Freq: Two times a day (BID) | ORAL | 0 refills | Status: AC

## 2016-11-04 NOTE — Discharge Instructions (Signed)
Please follow-up today with your OB/GYN to start prenatal care. Please start taking a prenatal vitamin every day. Do not drink alcohol while pregnant.  Your antibiotic is a pregnancy class B. Please take all of your medication even if you feel better before completing the course.

## 2016-11-04 NOTE — ED Provider Notes (Signed)
[facility]-EMERGENCY DEPT Provider Note   CSN: [id number] Arrival date & time: 11/03/16  2107     History   Chief Complaint Chief Complaint  Patient presents with  . Otalgia    HPI Kerry Jimenez is a 21 y.o. female who has been having pain in her right ear since this morning.  She reports she is able to hear out of the ear but that it is muffled.  She denies trauma.  No cough, sore throat.   Also she reports she isn't sure if she is pregnant, took a home pregnancy test at home but it was negative, LMP in march.  HPI  Past Medical History:  Diagnosis Date  . Anemia   . Medical history non-contributory     Patient Active Problem List   Diagnosis Date Noted  . Preeclampsia 08/26/2015  . Labor and delivery indication for care or intervention 08/25/2015  . Lumbar back pain 07/02/2015  . Supervision of normal first pregnancy 02/08/2015  . BV (bacterial vaginosis) 12/06/2012  . Condyloma acuminatum 12/06/2012    Past Surgical History:  Procedure Laterality Date  . NO PAST SURGERIES      OB History    Gravida Para Term Preterm AB Living   1 1 1  0 0 1   SAB TAB Ectopic Multiple Live Births   0 0 0 0 1       Home Medications    Prior to Admission medications   Medication Sig Start Date End Date Taking? Authorizing Provider  amoxicillin (AMOXIL) 500 MG tablet Take 1 tablet (500 mg total) by mouth 2 (two) times daily. 11/04/16 11/11/16  Cristina Gong, PA-C  cetirizine (ZYRTEC ALLERGY) 10 MG tablet Take 1 tablet (10 mg total) by mouth daily. 04/13/16   Mackuen, Courteney Lyn, MD  ibuprofen (ADVIL,MOTRIN) 600 MG tablet Take 1 tablet (600 mg total) by mouth every 6 (six) hours. Patient not taking: Reported on 04/13/2016 08/28/15   Orvilla Cornwall A, CNM  Iron-FA-B Cmp-C-Biot-Probiotic (FUSION PLUS) CAPS Take 1 tablet by mouth daily. Patient not taking: Reported on 04/13/2016 08/28/15   Orvilla Cornwall A, CNM  medroxyPROGESTERone (DEPO-PROVERA) 150 MG/ML injection  Inject 1 mL (150 mg total) into the muscle every 3 (three) months. 10/09/15   Orvilla Cornwall A, CNM  oxyCODONE-acetaminophen (PERCOCET/ROXICET) 5-325 MG tablet Take 1-2 tablets by mouth every 4 (four) hours as needed (pain scale > 7). Patient not taking: Reported on 04/13/2016 08/28/15   Orvilla Cornwall A, CNM  Prenat-FePoly-Metf-FA-DHA-DSS (VITAFOL FE+) 90-1-200 & 50 MG CPPK Take 2 tablets by mouth daily. Patient not taking: Reported on 04/13/2016 06/18/15   Roe Coombs, CNM    Family History Family History  Problem Relation Age of Onset  . Hypertension Unknown   . Diabetes Unknown   . Cancer Paternal Grandmother     Social History Social History  Substance Use Topics  . Smoking status: Never Smoker  . Smokeless tobacco: Never Used  . Alcohol use No     Allergies   Patient has no known allergies.   Review of Systems Review of Systems  Constitutional: Negative for fatigue and fever.  HENT: Positive for ear pain and hearing loss ("slightly muffled"). Negative for congestion, ear discharge, sinus pain, sinus pressure, sore throat and tinnitus.   Eyes: Negative for pain and visual disturbance.  Respiratory: Negative for cough.   Genitourinary: Positive for menstrual problem (Late for period). Negative for vaginal bleeding.  Skin: Negative for rash.  Neurological: Negative for headaches.  Physical Exam Updated Vital Signs BP 113/83 (BP Location: Right Arm)   Pulse 83   Temp 98.6 F (37 C) (Oral)   Resp 18   Ht 5' (1.524 m)   Wt 55.3 kg (122 lb)   LMP 08/30/2016   SpO2 100%   BMI 23.83 kg/m   Physical Exam  Constitutional: She appears well-developed and well-nourished.  HENT:  Head: Normocephalic and atraumatic.  Right Ear: Hearing and external ear normal. No swelling or tenderness. Tympanic membrane is erythematous and bulging. A middle ear effusion is present.  Left Ear: Hearing and external ear normal. No swelling or tenderness. Tympanic membrane is  not erythematous and not bulging. A middle ear effusion is present.  Nose: Nose normal.  Mouth/Throat: Oropharynx is clear and moist. No oropharyngeal exudate.  Eyes: Conjunctivae are normal. Right eye exhibits no discharge. Left eye exhibits no discharge.  Neck: Normal range of motion.  Pulmonary/Chest: Stridor present.  Lymphadenopathy:    She has cervical adenopathy.  Neurological: She is alert.  Skin: Skin is warm and dry. Rash noted. She is not diaphoretic.  Psychiatric: She has a normal mood and affect. Her behavior is normal.  Nursing note and vitals reviewed.    ED Treatments / Results  Labs (all labs ordered are listed, but only abnormal results are displayed) Labs Reviewed  POC URINE PREG, ED - Abnormal; Notable for the following:       Result Value   Preg Test, Ur POSITIVE (*)    All other components within normal limits    EKG  EKG Interpretation None       Radiology No results found.  Procedures Procedures (including critical care time)  Medications Ordered in ED Medications - No data to display   Initial Impression / Assessment and Plan / ED Course  I have reviewed the triage vital signs and the nursing notes.  Pertinent labs & imaging results that were available during my care of the patient were reviewed by me and considered in my medical decision making (see chart for details).    Patient presents with otalgia and exam consistent with acute otitis media. No concern for acute mastoiditis, meningitis. No antibiotic use in the last month.  Patient discharged home with Amoxicillin.  Advised patient to call PCP today for follow-up.  I have also discussed reasons to return immediately to the ER.  Parent expresses understanding and agrees with plan.  Patient is pregnant.  Before she was informed she was asked again if it was ok to discuss all test results in front of her fiancee and she gave verbal permission.  Patient was informed she is pregnant.  She was  advised Amoxicillin is pregnancy class B and what that means.  She was advised to follow up with her OB/GYN, start taking a prenatal vitamin, and not drink alcohol.  She has a young child, states she remembers the food restrictions and general pregnancy guidelines.  States her understanding..  Patient was given the option to ask questions, all of which were answered to the best of my ability.    Final Clinical Impressions(s) / ED Diagnoses   Final diagnoses:  Pregnancy, unspecified gestational age  Acute otitis media, unspecified otitis media type    New Prescriptions Discharge Medication List as of 11/04/2016 12:26 AM    START taking these medications   Details  amoxicillin (AMOXIL) 500 MG tablet Take 1 tablet (500 mg total) by mouth 2 (two) times daily., Starting Wed 11/04/2016, Until Wed 11/11/2016,  Print         Norman Clay 11/04/16 1929    Nira Conn, MD 11/06/16 [phone]

## 2016-12-01 ENCOUNTER — Other Ambulatory Visit (HOSPITAL_COMMUNITY)
Admission: RE | Admit: 2016-12-01 | Discharge: 2016-12-01 | Disposition: A | Discharge status: Home / Self Care | Payer: BLUE CROSS/BLUE SHIELD | Source: Ambulatory Visit | Attending: Obstetrics & Gynecology | Admitting: Obstetrics & Gynecology

## 2016-12-01 ENCOUNTER — Encounter: Payer: Self-pay | Admitting: Obstetrics & Gynecology

## 2016-12-01 ENCOUNTER — Ambulatory Visit (INDEPENDENT_AMBULATORY_CARE_PROVIDER_SITE_OTHER): Payer: BLUE CROSS/BLUE SHIELD | Admitting: Obstetrics & Gynecology

## 2016-12-01 DIAGNOSIS — Z3481 Encounter for supervision of other normal pregnancy, first trimester: Secondary | ICD-10-CM

## 2016-12-01 DIAGNOSIS — Z113 Encounter for screening for infections with a predominantly sexual mode of transmission: Secondary | ICD-10-CM

## 2016-12-01 DIAGNOSIS — Z3491 Encounter for supervision of normal pregnancy, unspecified, first trimester: Secondary | ICD-10-CM

## 2016-12-01 DIAGNOSIS — Z3490 Encounter for supervision of normal pregnancy, unspecified, unspecified trimester: Secondary | ICD-10-CM | POA: Insufficient documentation

## 2016-12-01 DIAGNOSIS — Z8759 Personal history of other complications of pregnancy, childbirth and the puerperium: Secondary | ICD-10-CM

## 2016-12-01 MED ORDER — FLUCONAZOLE 150 MG PO TABS: 150.0000 mg | ORAL_TABLET | Freq: Once | ORAL | 0 refills | Status: AC

## 2016-12-01 MED ORDER — DOXYLAMINE-PYRIDOXINE 10-10 MG PO TBEC: 1.0000 | DELAYED_RELEASE_TABLET | Freq: Two times a day (BID) | ORAL | 1 refills | Status: DC

## 2016-12-01 NOTE — Patient Instructions (Signed)

## 2016-12-01 NOTE — Progress Notes (Signed)
  Subjective:    Kerry Jimenez is a G2P1001 2746w1d being seen today for her first obstetrical visit.  Her obstetrical history is significant for history of gestational HTN without severe features. Patient does intend to breast feed. Pregnancy history fully reviewed.  Patient reports no complaints.  Vitals:   12/01/16 1341  BP: 107/68  Pulse: 80  Weight: 133 lb (60.3 kg)    HISTORY: OB History  Gravida Para Term Preterm AB Living  2 1 1  0 0 1  SAB TAB Ectopic Multiple Live Births  0 0 0 0 1    # Outcome Date GA Lbr Len/2nd Weight Sex Delivery Anes PTL Lv  2 Current           1 Term 08/26/15 6955w3d 26:35 / 00:24 7 lb 0.3 oz (3.184 kg) F Vag-Spont EPI  LIV     Past Medical History:  Diagnosis Date  . Anemia   . Medical history non-contributory    Past Surgical History:  Procedure Laterality Date  . NO PAST SURGERIES     Family History  Problem Relation Age of Onset  . Hypertension Unknown   . Diabetes Unknown   . Cancer Paternal Grandmother      Exam    Uterus:   8 weeks size  Pelvic Exam:    Perineum: No Hemorrhoids   Vulva: normal   Vagina:  curdlike discharge   pH:     Cervix: no lesions   Adnexa: normal adnexa   Bony Pelvis: average  System: Breast:  normal appearance, no masses or tenderness   Skin: normal coloration and turgor, no rashes    Neurologic: oriented, normal mood   Extremities: normal strength, tone, and muscle mass   HEENT PERRLA, neck supple with midline trachea and thyroid without masses   Mouth/Teeth mucous membranes moist, pharynx normal without lesions and dental hygiene good   Neck supple and no masses   Cardiovascular: regular rate and rhythm, no murmurs or gallops   Respiratory:  appears well, vitals normal, no respiratory distress, acyanotic, normal RR, neck free of mass or lymphadenopathy, chest clear, no wheezing, crepitations, rhonchi, normal symmetric air entry   Abdomen: soft, non-tender; bowel sounds normal; no masses,  no  organomegaly   Urinary: urethral meatus normal      Assessment:    Pregnancy: G2P1001 Patient Active Problem List   Diagnosis Date Noted  . Encounter for supervision of normal pregnancy, unspecified, unspecified trimester 12/01/2016  . History of gestational hypertension 12/01/2016        Plan:     Initial labs drawn. Prenatal vitamins. Problem list reviewed and updated. Genetic Screening discussed First Screen: 12-14 weeks.  Ultrasound discussed; fetal survey: 18+ WEEKS.  Follow up in 4 weeks. 50% of 30 min visit spent on counseling and coordination of care.  Needs US for dating   Scheryl DarterJames Vivion Romano 12/01/2016

## 2016-12-01 NOTE — Progress Notes (Signed)
PA for Diclegis has been submitted and approved.  Notification sent to pharmacy.

## 2016-12-02 LAB — CERVICOVAGINAL ANCILLARY ONLY
Bacterial vaginitis: NEGATIVE
CANDIDA VAGINITIS: POSITIVE — AB
CHLAMYDIA, DNA PROBE: NEGATIVE
Neisseria Gonorrhea: NEGATIVE
TRICH (WINDOWPATH): NEGATIVE

## 2016-12-03 LAB — URINE CULTURE, OB REFLEX

## 2016-12-03 LAB — CULTURE, OB URINE

## 2016-12-08 LAB — OBSTETRIC PANEL, INCLUDING HIV
ANTIBODY SCREEN: NEGATIVE
Basophils Absolute: 0 10*3/uL (ref 0.0–0.2)
Basos: 0 %
EOS (ABSOLUTE): 0.1 10*3/uL (ref 0.0–0.4)
Eos: 1 %
HEMATOCRIT: 35 % (ref 34.0–46.6)
HIV Screen 4th Generation wRfx: NONREACTIVE
Hemoglobin: 11.9 g/dL (ref 11.1–15.9)
Hepatitis B Surface Ag: NEGATIVE
IMMATURE GRANS (ABS): 0 10*3/uL (ref 0.0–0.1)
IMMATURE GRANULOCYTES: 0 %
LYMPHS: 28 %
Lymphocytes Absolute: 2 10*3/uL (ref 0.7–3.1)
MCH: 27.5 pg (ref 26.6–33.0)
MCHC: 34 g/dL (ref 31.5–35.7)
MCV: 81 fL (ref 79–97)
MONOCYTES: 5 %
MONOS ABS: 0.4 10*3/uL (ref 0.1–0.9)
NEUTROS PCT: 66 %
Neutrophils Absolute: 4.6 10*3/uL (ref 1.4–7.0)
PLATELETS: 141 10*3/uL — AB (ref 150–379)
RBC: 4.33 x10E6/uL (ref 3.77–5.28)
RDW: 14.5 % (ref 12.3–15.4)
RPR Ser Ql: NONREACTIVE
RUBELLA: 6.26 {index} (ref >0.99)
Rh Factor: POSITIVE
WBC: 7 10*3/uL (ref 3.4–10.8)

## 2016-12-08 LAB — CYSTIC FIBROSIS MUTATION 97: GENE DIS ANAL CARRIER INTERP BLD/T-IMP: NOT DETECTED

## 2016-12-08 LAB — VARICELLA ZOSTER ANTIBODY, IGG

## 2016-12-10 ENCOUNTER — Ambulatory Visit (HOSPITAL_COMMUNITY): Admission: RE | Admit: 2016-12-10 | Payer: BLUE CROSS/BLUE SHIELD | Source: Ambulatory Visit

## 2016-12-31 ENCOUNTER — Encounter: Payer: BLUE CROSS/BLUE SHIELD | Admitting: Certified Nurse Midwife

## 2016-12-31 ENCOUNTER — Encounter: Payer: BLUE CROSS/BLUE SHIELD | Admitting: Obstetrics & Gynecology

## 2016-12-31 ENCOUNTER — Ambulatory Visit (INDEPENDENT_AMBULATORY_CARE_PROVIDER_SITE_OTHER): Payer: BLUE CROSS/BLUE SHIELD | Admitting: Certified Nurse Midwife

## 2016-12-31 VITALS — BP 124/77 | HR 77 | Wt 127.0 lb

## 2016-12-31 DIAGNOSIS — B373 Candidiasis of vulva and vagina: Secondary | ICD-10-CM

## 2016-12-31 DIAGNOSIS — Z3481 Encounter for supervision of other normal pregnancy, first trimester: Secondary | ICD-10-CM

## 2016-12-31 DIAGNOSIS — Z8759 Personal history of other complications of pregnancy, childbirth and the puerperium: Secondary | ICD-10-CM

## 2016-12-31 DIAGNOSIS — R51 Headache: Secondary | ICD-10-CM

## 2016-12-31 DIAGNOSIS — B3731 Acute candidiasis of vulva and vagina: Secondary | ICD-10-CM

## 2016-12-31 DIAGNOSIS — Z3480 Encounter for supervision of other normal pregnancy, unspecified trimester: Secondary | ICD-10-CM

## 2016-12-31 DIAGNOSIS — O26891 Other specified pregnancy related conditions, first trimester: Secondary | ICD-10-CM

## 2016-12-31 MED ORDER — BUTALBITAL-APAP-CAFFEINE 50-325-40 MG PO TABS: 1.0000 | ORAL_TABLET | Freq: Four times a day (QID) | ORAL | 4 refills | Status: DC | PRN

## 2016-12-31 MED ORDER — TERCONAZOLE 0.8 % VA CREA: 1.0000 | TOPICAL_CREAM | Freq: Every day | VAGINAL | 0 refills | Status: DC

## 2016-12-31 MED ORDER — PRENATE PIXIE 10-0.6-0.4-200 MG PO CAPS: 1.0000 | ORAL_CAPSULE | Freq: Every day | ORAL | 12 refills | Status: DC

## 2016-12-31 MED ORDER — ASPIRIN 81 MG PO CHEW: 81.0000 mg | CHEWABLE_TABLET | Freq: Every day | ORAL | 12 refills | Status: DC

## 2016-12-31 NOTE — Addendum Note (Signed)
Addended by: Orvilla CornwallENNEY, Reade Trefz A on: 12/31/2016 04:49 PM   Modules accepted: Orders

## 2016-12-31 NOTE — Progress Notes (Signed)
   PRENATAL VISIT NOTE  Subjective:  Kerry Jimenez is a 21 y.o. G2P1001 at 3766w3d being seen today for ongoing prenatal care.  She is currently monitored for the following issues for this low-risk pregnancy and has Encounter for supervision of normal pregnancy, unspecified, unspecified trimester and History of gestational hypertension on her problem list.  Patient reports no bleeding, no contractions, no cramping, no leaking and leg cramps, headaches not relieved with Tylenol.  Contractions: Not present. Vag. Bleeding: None.   . Denies leaking of fluid.   The following portions of the patient's history were reviewed and updated as appropriate: allergies, current medications, past family history, past medical history, past social history, past surgical history and problem list. Problem list updated.  Objective:   Vitals:   12/31/16 1554  BP: 124/77  Pulse: 77  Weight: 127 lb (57.6 kg)    Fetal Status: Fetal Heart Rate (bpm): 158-162         General:  Alert, oriented and cooperative. Patient is in no acute distress.  Skin: Skin is warm and dry. No rash noted.   Cardiovascular: Normal heart rate noted  Respiratory: Normal respiratory effort, no problems with respiration noted  Abdomen: Soft, gravid, appropriate for gestational age.  Pain/Pressure: Absent     Pelvic: Cervical exam deferred        Extremities: Normal range of motion.  Edema: Trace  Mental Status:  Normal mood and affect. Normal behavior. Normal judgment and thought content.   Assessment and Plan:  Pregnancy: G2P1001 at 8666w3d  1. Supervision of other normal pregnancy, antepartum      - Prenat-FeAsp-Meth-FA-DHA w/o A (PRENATE PIXIE) 10-0.6-0.4-200 MG CAPS; Take 1 tablet by mouth daily.  Dispense: 30 capsule; Refill: 12 - MaterniT21 PLUS Core+SCA - Hemoglobin A1c - US MFM OB COMP + 14 WK; Future  2. History of gestational hypertension     - aspirin 81 MG chewable tablet; Chew 1 tablet (81 mg total) by mouth daily.   Dispense: 30 tablet; Refill: 12  Preterm labor symptoms and general obstetric precautions including but not limited to vaginal bleeding, contractions, leaking of fluid and fetal movement were reviewed in detail with the patient. Please refer to After Visit Summary for other counseling recommendations.  Return in about 2 months (around 03/03/2017) for ROB, babyscripts.   Roe Coombsachelle A Afsana Liera, CNM

## 2016-12-31 NOTE — Progress Notes (Signed)
Patient complains of pain in feet and legs when she stands or walks too much.

## 2017-01-01 LAB — HEMOGLOBIN A1C
ESTIMATED AVERAGE GLUCOSE: 100 mg/dL
Hgb A1c MFr Bld: 5.1 % (ref 4.8–5.6)

## 2017-01-05 ENCOUNTER — Other Ambulatory Visit: Payer: Self-pay | Admitting: Certified Nurse Midwife

## 2017-01-05 DIAGNOSIS — Z3480 Encounter for supervision of other normal pregnancy, unspecified trimester: Secondary | ICD-10-CM

## 2017-01-05 LAB — MATERNIT21 PLUS CORE+SCA
Chromosome 13: NEGATIVE
Chromosome 18: NEGATIVE
Chromosome 21: NEGATIVE
Y Chromosome: NOT DETECTED

## 2017-01-28 ENCOUNTER — Encounter: Payer: BLUE CROSS/BLUE SHIELD | Admitting: Certified Nurse Midwife

## 2017-02-01 ENCOUNTER — Ambulatory Visit (INDEPENDENT_AMBULATORY_CARE_PROVIDER_SITE_OTHER): Payer: BLUE CROSS/BLUE SHIELD | Admitting: Certified Nurse Midwife

## 2017-02-01 VITALS — BP 107/73 | HR 64 | Wt 128.1 lb

## 2017-02-01 DIAGNOSIS — Z3480 Encounter for supervision of other normal pregnancy, unspecified trimester: Secondary | ICD-10-CM

## 2017-02-01 DIAGNOSIS — Z8759 Personal history of other complications of pregnancy, childbirth and the puerperium: Secondary | ICD-10-CM

## 2017-02-01 NOTE — Progress Notes (Signed)
   PRENATAL VISIT NOTE  Subjective:  Kerry Jimenez is a 21 y.o. G2P1001 at [redacted]w[redacted]d being seen today for ongoing prenatal care.  She is currently monitored for the following issues for this low-risk pregnancy and has Encounter for supervision of normal pregnancy, unspecified, unspecified trimester and History of gestational hypertension on her problem list.  Patient reports no complaints.  Contractions: Irritability. Vag. Bleeding: None.  Movement: Present. Denies leaking of fluid.   The following portions of the patient's history were reviewed and updated as appropriate: allergies, current medications, past family history, past medical history, past social history, past surgical history and problem list. Problem list updated.  Objective:   Vitals:   02/01/17 1526  BP: 107/73  Pulse: 64  Weight: 128 lb 1.6 oz (58.1 kg)    Fetal Status: Fetal Heart Rate (bpm): 144; doppler Fundal Height: 17 cm Movement: Present     General:  Alert, oriented and cooperative. Patient is in no acute distress.  Skin: Skin is warm and dry. No rash noted.   Cardiovascular: Normal heart rate noted  Respiratory: Normal respiratory effort, no problems with respiration noted  Abdomen: Soft, gravid, appropriate for gestational age.  Pain/Pressure: Present     Pelvic: Cervical exam deferred        Extremities: Normal range of motion.  Edema: Trace  Mental Status:  Normal mood and affect. Normal behavior. Normal judgment and thought content.   Assessment and Plan:  Pregnancy: G2P1001 at [redacted]w[redacted]d  1. Supervision of other normal pregnancy, antepartum     Doing well - AFP, Serum, Open Spina Bifida  2. History of gestational hypertension     On baby ASA  Preterm labor symptoms and general obstetric precautions including but not limited to vaginal bleeding, contractions, leaking of fluid and fetal movement were reviewed in detail with the patient. Please refer to After Visit Summary for other counseling  recommendations.  Return in about 4 weeks (around 03/01/2017) for ROB.   Roe Coombs, CNM

## 2017-02-01 NOTE — Progress Notes (Signed)
Patient states that she thinks that she feels a little bit of fetal movement, reports feeling pressure and irritability, denies bleeding. Pt states that she has had issues connecting babyrx to her phone, gave pt tech support card.

## 2017-02-04 ENCOUNTER — Other Ambulatory Visit: Payer: Self-pay | Admitting: Certified Nurse Midwife

## 2017-02-04 DIAGNOSIS — Z3480 Encounter for supervision of other normal pregnancy, unspecified trimester: Secondary | ICD-10-CM

## 2017-02-04 LAB — AFP, SERUM, OPEN SPINA BIFIDA
AFP MoM: 1.29
AFP Value: 59.4 ng/mL
GEST. AGE ON COLLECTION DATE: 17 wk
Maternal Age At EDD: 22.9 yr
OSBR RISK 1 IN: 9894
TEST RESULTS AFP: NEGATIVE
WEIGHT: 128 [lb_av]

## 2017-02-11 ENCOUNTER — Other Ambulatory Visit: Payer: Self-pay | Admitting: Certified Nurse Midwife

## 2017-02-12 ENCOUNTER — Encounter (HOSPITAL_COMMUNITY): Payer: Self-pay

## 2017-02-12 ENCOUNTER — Other Ambulatory Visit: Payer: Self-pay | Admitting: Certified Nurse Midwife

## 2017-02-12 ENCOUNTER — Ambulatory Visit (HOSPITAL_COMMUNITY)
Admission: RE | Admit: 2017-02-12 | Discharge: 2017-02-12 | Disposition: A | Discharge status: Home / Self Care | Payer: BLUE CROSS/BLUE SHIELD | Source: Ambulatory Visit | Attending: Certified Nurse Midwife | Admitting: Certified Nurse Midwife

## 2017-02-12 DIAGNOSIS — Z3A18 18 weeks gestation of pregnancy: Secondary | ICD-10-CM

## 2017-02-12 DIAGNOSIS — Z3687 Encounter for antenatal screening for uncertain dates: Secondary | ICD-10-CM | POA: Diagnosis not present

## 2017-02-12 DIAGNOSIS — Z3480 Encounter for supervision of other normal pregnancy, unspecified trimester: Secondary | ICD-10-CM

## 2017-02-12 DIAGNOSIS — Z3689 Encounter for other specified antenatal screening: Secondary | ICD-10-CM | POA: Diagnosis present

## 2017-03-01 ENCOUNTER — Encounter: Payer: BLUE CROSS/BLUE SHIELD | Admitting: Certified Nurse Midwife

## 2017-03-10 ENCOUNTER — Encounter: Payer: BLUE CROSS/BLUE SHIELD | Admitting: Obstetrics

## 2017-03-15 ENCOUNTER — Encounter: Payer: BLUE CROSS/BLUE SHIELD | Admitting: Certified Nurse Midwife

## 2017-03-18 ENCOUNTER — Ambulatory Visit (INDEPENDENT_AMBULATORY_CARE_PROVIDER_SITE_OTHER): Payer: BLUE CROSS/BLUE SHIELD | Admitting: Certified Nurse Midwife

## 2017-03-18 VITALS — BP 113/72 | HR 102 | Wt 130.7 lb

## 2017-03-18 DIAGNOSIS — R51 Headache: Secondary | ICD-10-CM

## 2017-03-18 DIAGNOSIS — B373 Candidiasis of vulva and vagina: Secondary | ICD-10-CM

## 2017-03-18 DIAGNOSIS — Z3493 Encounter for supervision of normal pregnancy, unspecified, third trimester: Secondary | ICD-10-CM

## 2017-03-18 DIAGNOSIS — Z8759 Personal history of other complications of pregnancy, childbirth and the puerperium: Secondary | ICD-10-CM

## 2017-03-18 DIAGNOSIS — O26892 Other specified pregnancy related conditions, second trimester: Secondary | ICD-10-CM

## 2017-03-18 DIAGNOSIS — M545 Low back pain: Secondary | ICD-10-CM

## 2017-03-18 DIAGNOSIS — O26893 Other specified pregnancy related conditions, third trimester: Secondary | ICD-10-CM

## 2017-03-18 DIAGNOSIS — B3731 Acute candidiasis of vulva and vagina: Secondary | ICD-10-CM

## 2017-03-18 DIAGNOSIS — Z3490 Encounter for supervision of normal pregnancy, unspecified, unspecified trimester: Secondary | ICD-10-CM

## 2017-03-18 DIAGNOSIS — R519 Headache, unspecified: Secondary | ICD-10-CM

## 2017-03-18 MED ORDER — FLUCONAZOLE 150 MG PO TABS: 150.0000 mg | ORAL_TABLET | Freq: Once | ORAL | 0 refills | Status: AC

## 2017-03-18 MED ORDER — TERCONAZOLE 0.8 % VA CREA: 1.0000 | TOPICAL_CREAM | Freq: Every day | VAGINAL | 0 refills | Status: DC

## 2017-03-18 MED ORDER — CYCLOBENZAPRINE HCL 10 MG PO TABS: 10.0000 mg | ORAL_TABLET | Freq: Three times a day (TID) | ORAL | 1 refills | Status: DC | PRN

## 2017-03-18 MED ORDER — COMFORT FIT MATERNITY SUPP LG MISC: 1.0000 [IU] | Freq: Every day | 0 refills | Status: DC

## 2017-03-18 NOTE — Progress Notes (Signed)
Declined FLU vaccine. Left leg pain and spinal pain.  Complains of headaches everyday they get worse in the afternoons with lightheadedness/dizziness. Also recurrent yeast infetions.

## 2017-03-19 NOTE — Progress Notes (Signed)
   PRENATAL VISIT NOTE  Subjective:  Kerry Jimenez is a 21 y.o. G2P1001 at [redacted]w[redacted]d being seen today for ongoing prenatal care.  She is currently monitored for the following issues for this low-risk pregnancy and has Encounter for supervision of normal pregnancy, unspecified, unspecified trimester and History of gestational hypertension on her problem list.  Patient reports no bleeding, no contractions, no cramping and no leaking.  Contractions: Irritability. Vag. Bleeding: None.  Movement: Present. Denies leaking of fluid.   The following portions of the patient's history were reviewed and updated as appropriate: allergies, current medications, past family history, past medical history, past social history, past surgical history and problem list. Problem list updated.  Objective:   Vitals:   03/18/17 1540  BP: 113/72  Pulse: (!) 102  Weight: 130 lb 11.2 oz (59.3 kg)    Fetal Status: Fetal Heart Rate (bpm): 147; doppler Fundal Height: 23 cm Movement: Present     General:  Alert, oriented and cooperative. Patient is in no acute distress.  Skin: Skin is warm and dry. No rash noted.   Cardiovascular: Normal heart rate noted  Respiratory: Normal respiratory effort, no problems with respiration noted  Abdomen: Soft, gravid, appropriate for gestational age.  Pain/Pressure: Present     Pelvic: Cervical exam deferred        Extremities: Normal range of motion.  Edema: Trace  Mental Status:  Normal mood and affect. Normal behavior. Normal judgment and thought content.   Assessment and Plan:  Pregnancy: G2P1001 at [redacted]w[redacted]d  1. Encounter for supervision of normal pregnancy, antepartum, unspecified gravidity      - Culture, OB Urine  2. History of gestational hypertension      - Culture, OB Urine  3. Pregnancy headache in second trimester      - cyclobenzaprine (FLEXERIL) 10 MG tablet; Take 1 tablet (10 mg total) by mouth every 8 (eight) hours as needed for muscle spasms.  Dispense: 30  tablet; Refill: 1 - Culture, OB Urine  4. Bilateral low back pain, unspecified chronicity, with sciatica presence unspecified     Homeopathic remedies discussed - Elastic Bandages & Supports (COMFORT FIT MATERNITY SUPP LG) MISC; 1 Units by Does not apply route daily.  Dispense: 1 each; Refill: 0 - Culture, OB Urine  5. Yeast vaginitis    - fluconazole (DIFLUCAN) 150 MG tablet; Take 1 tablet (150 mg total) by mouth once.  Dispense: 1 tablet; Refill: 0 - terconazole (TERAZOL 3) 0.8 % vaginal cream; Place 1 applicator vaginally at bedtime.  Dispense: 20 g; Refill: 0 - Culture, OB Urine  Preterm labor symptoms and general obstetric precautions including but not limited to vaginal bleeding, contractions, leaking of fluid and fetal movement were reviewed in detail with the patient. Please refer to After Visit Summary for other counseling recommendations.  Return in about 4 weeks (around 04/15/2017) for ROB, 2 hr OGTT.   Roe Coombs, CNM

## 2017-03-20 LAB — URINE CULTURE, OB REFLEX

## 2017-03-20 LAB — CULTURE, OB URINE

## 2017-04-13 ENCOUNTER — Ambulatory Visit (INDEPENDENT_AMBULATORY_CARE_PROVIDER_SITE_OTHER): Payer: BLUE CROSS/BLUE SHIELD | Admitting: Certified Nurse Midwife

## 2017-04-13 ENCOUNTER — Other Ambulatory Visit: Payer: BLUE CROSS/BLUE SHIELD

## 2017-04-13 ENCOUNTER — Encounter: Payer: Self-pay | Admitting: Certified Nurse Midwife

## 2017-04-13 DIAGNOSIS — Z3482 Encounter for supervision of other normal pregnancy, second trimester: Secondary | ICD-10-CM

## 2017-04-13 DIAGNOSIS — Z3490 Encounter for supervision of normal pregnancy, unspecified, unspecified trimester: Secondary | ICD-10-CM

## 2017-04-13 NOTE — Progress Notes (Signed)
ROB/GTT. 

## 2017-04-13 NOTE — Progress Notes (Signed)
   PRENATAL VISIT NOTE  Subjective:  Kerry Jimenez is a 21 y.o. G2P1001 at 334w6d being seen today for ongoing prenatal care.  She is currently monitored for the following issues for this low-risk pregnancy and has Encounter for supervision of normal pregnancy, unspecified, unspecified trimester and History of gestational hypertension on her problem list.  Patient reports no complaints.  Contractions: Irritability. Vag. Bleeding: None.  Movement: Present. Denies leaking of fluid.   The following portions of the patient's history were reviewed and updated as appropriate: allergies, current medications, past family history, past medical history, past social history, past surgical history and problem list. Problem list updated.  Objective:   Vitals:   04/13/17 0904  BP: 125/69  Pulse: 92  Weight: 130 lb 6.4 oz (59.1 kg)    Fetal Status: Fetal Heart Rate (bpm): 150; doppler Fundal Height: 26 cm Movement: Present     General:  Alert, oriented and cooperative. Patient is in no acute distress.  Skin: Skin is warm and dry. No rash noted.   Cardiovascular: Normal heart rate noted  Respiratory: Normal respiratory effort, no problems with respiration noted  Abdomen: Soft, gravid, appropriate for gestational age.  Pain/Pressure: Present     Pelvic: Cervical exam deferred        Extremities: Normal range of motion.  Edema: Trace  Mental Status:  Normal mood and affect. Normal behavior. Normal judgment and thought content.   Assessment and Plan:  Pregnancy: G2P1001 at 724w6d  1. Encounter for supervision of normal pregnancy, antepartum, unspecified gravidity      Doing well.  - Glucose Tolerance, 2 Hours w/1 Hour - CBC - HIV antibody (with reflex) - RPR  Preterm labor symptoms and general obstetric precautions including but not limited to vaginal bleeding, contractions, leaking of fluid and fetal movement were reviewed in detail with the patient. Please refer to After Visit Summary for  other counseling recommendations.  Return in about 2 weeks (around 04/27/2017) for ROB.   Roe Coombsachelle A Kaidynce Pfister, CNM

## 2017-04-14 ENCOUNTER — Other Ambulatory Visit: Payer: Self-pay | Admitting: Certified Nurse Midwife

## 2017-04-14 DIAGNOSIS — O99012 Anemia complicating pregnancy, second trimester: Secondary | ICD-10-CM

## 2017-04-14 LAB — GLUCOSE TOLERANCE, 2 HOURS W/ 1HR
Glucose, 1 hour: 74 mg/dL (ref 65–179)
Glucose, 2 hour: 90 mg/dL (ref 65–152)
Glucose, Fasting: 71 mg/dL (ref 65–91)

## 2017-04-14 LAB — CBC
HEMATOCRIT: 31.4 % — AB (ref 34.0–46.6)
HEMOGLOBIN: 9.9 g/dL — AB (ref 11.1–15.9)
MCH: 26.9 pg (ref 26.6–33.0)
MCHC: 31.5 g/dL (ref 31.5–35.7)
MCV: 85 fL (ref 79–97)
Platelets: 193 10*3/uL (ref 150–379)
RBC: 3.68 x10E6/uL — AB (ref 3.77–5.28)
RDW: 15 % (ref 12.3–15.4)
WBC: 6.2 10*3/uL (ref 3.4–10.8)

## 2017-04-14 LAB — SYPHILIS: RPR W/REFLEX TO RPR TITER AND TREPONEMAL ANTIBODIES, TRADITIONAL SCREENING AND DIAGNOSIS ALGORITHM: RPR Ser Ql: NONREACTIVE

## 2017-04-14 LAB — HIV ANTIBODY (ROUTINE TESTING W REFLEX): HIV Screen 4th Generation wRfx: NONREACTIVE

## 2017-04-14 MED ORDER — CITRANATAL BLOOM 90-1 MG PO TABS: 1.0000 | ORAL_TABLET | Freq: Every day | ORAL | 12 refills | Status: DC

## 2017-04-27 ENCOUNTER — Other Ambulatory Visit: Payer: Self-pay

## 2017-04-27 ENCOUNTER — Ambulatory Visit (INDEPENDENT_AMBULATORY_CARE_PROVIDER_SITE_OTHER): Payer: BLUE CROSS/BLUE SHIELD | Admitting: Certified Nurse Midwife

## 2017-04-27 ENCOUNTER — Encounter: Payer: Self-pay | Admitting: Certified Nurse Midwife

## 2017-04-27 VITALS — BP 116/69 | HR 93 | Wt 134.0 lb

## 2017-04-27 DIAGNOSIS — Z3480 Encounter for supervision of other normal pregnancy, unspecified trimester: Secondary | ICD-10-CM

## 2017-04-27 NOTE — Progress Notes (Signed)
   PRENATAL VISIT NOTE  Subjective:  Kerry Jimenez is a 21 y.o. G2P1001 at 2836w6d being seen today for ongoing prenatal care.  She is currently monitored for the following issues for this low-risk pregnancy and has Encounter for supervision of normal pregnancy, unspecified, unspecified trimester and History of gestational hypertension on their problem list.  Patient reports no complaints.  Contractions: Irritability. Vag. Bleeding: None.  Movement: Present. Denies leaking of fluid.   The following portions of the patient's history were reviewed and updated as appropriate: allergies, current medications, past family history, past medical history, past social history, past surgical history and problem list. Problem list updated.  Objective:   Vitals:   04/27/17 1601  BP: 116/69  Pulse: 93  Weight: 134 lb (60.8 kg)    Fetal Status: Fetal Heart Rate (bpm): 140; doppler Fundal Height: 28 cm Movement: Present     General:  Alert, oriented and cooperative. Patient is in no acute distress.  Skin: Skin is warm and dry. No rash noted.   Cardiovascular: Normal heart rate noted  Respiratory: Normal respiratory effort, no problems with respiration noted  Abdomen: Soft, gravid, appropriate for gestational age.  Pain/Pressure: Present     Pelvic: Cervical exam deferred        Extremities: Normal range of motion.  Edema: Trace  Mental Status:  Normal mood and affect. Normal behavior. Normal judgment and thought content.   Assessment and Plan:  Pregnancy: G2P1001 at 8236w6d  1. Supervision of other normal pregnancy, antepartum     Doing well.    Preterm labor symptoms and general obstetric precautions including but not limited to vaginal bleeding, contractions, leaking of fluid and fetal movement were reviewed in detail with the patient. Please refer to After Visit Summary for other counseling recommendations.  Return in about 2 weeks (around 05/11/2017) for ROB.   Roe Coombsachelle A Virginie Josten, CNM

## 2017-05-12 ENCOUNTER — Ambulatory Visit (INDEPENDENT_AMBULATORY_CARE_PROVIDER_SITE_OTHER): Payer: BLUE CROSS/BLUE SHIELD | Admitting: Certified Nurse Midwife

## 2017-05-12 ENCOUNTER — Encounter: Payer: Self-pay | Admitting: Certified Nurse Midwife

## 2017-05-12 DIAGNOSIS — Z3480 Encounter for supervision of other normal pregnancy, unspecified trimester: Secondary | ICD-10-CM

## 2017-05-12 DIAGNOSIS — Z3483 Encounter for supervision of other normal pregnancy, third trimester: Secondary | ICD-10-CM

## 2017-05-12 NOTE — Progress Notes (Signed)
   PRENATAL VISIT NOTE  Subjective:  Kerry Jimenez is a 21 y.o. G2P1001 at 8463w0d being seen today for ongoing prenatal care.  She is currently monitored for the following issues for this low-risk pregnancy and has Encounter for supervision of normal pregnancy, unspecified, unspecified trimester and History of gestational hypertension on their problem list.  Patient reports no complaints.  Contractions: Not present. Vag. Bleeding: None.  Movement: Present. Denies leaking of fluid.   The following portions of the patient's history were reviewed and updated as appropriate: allergies, current medications, past family history, past medical history, past social history, past surgical history and problem list. Problem list updated.  Objective:   Vitals:   05/12/17 1600  BP: 123/72  Pulse: 93  Weight: 132 lb 4.8 oz (60 kg)    Fetal Status: Fetal Heart Rate (bpm): 145; doppler Fundal Height: 30 cm Movement: Present     General:  Alert, oriented and cooperative. Patient is in no acute distress.  Skin: Skin is warm and dry. No rash noted.   Cardiovascular: Normal heart rate noted  Respiratory: Normal respiratory effort, no problems with respiration noted  Abdomen: Soft, gravid, appropriate for gestational age.  Pain/Pressure: Absent     Pelvic: Cervical exam deferred        Extremities: Normal range of motion.  Edema: Trace  Mental Status:  Normal mood and affect. Normal behavior. Normal judgment and thought content.   Assessment and Plan:  Pregnancy: G2P1001 at 4263w0d  1. Supervision of other normal pregnancy, antepartum     Doing well.  Preterm labor symptoms and general obstetric precautions including but not limited to vaginal bleeding, contractions, leaking of fluid and fetal movement were reviewed in detail with the patient. Please refer to After Visit Summary for other counseling recommendations.  Return in about 2 weeks (around 05/26/2017) for ROB.   Roe Coombsachelle A Harjas Biggins, CNM

## 2017-05-12 NOTE — Progress Notes (Signed)
Pt denies concerns at this time. 

## 2017-05-26 ENCOUNTER — Encounter: Payer: BLUE CROSS/BLUE SHIELD | Admitting: Certified Nurse Midwife

## 2017-06-02 ENCOUNTER — Inpatient Hospital Stay (HOSPITAL_COMMUNITY)
Admission: AD | Admit: 2017-06-02 | Discharge: 2017-06-02 | Disposition: A | Discharge status: Home / Self Care | Payer: BLUE CROSS/BLUE SHIELD | Source: Ambulatory Visit | Attending: Obstetrics & Gynecology | Admitting: Obstetrics & Gynecology

## 2017-06-02 ENCOUNTER — Ambulatory Visit (INDEPENDENT_AMBULATORY_CARE_PROVIDER_SITE_OTHER): Payer: BLUE CROSS/BLUE SHIELD | Admitting: Certified Nurse Midwife

## 2017-06-02 ENCOUNTER — Other Ambulatory Visit: Payer: Self-pay

## 2017-06-02 ENCOUNTER — Encounter (HOSPITAL_COMMUNITY): Payer: Self-pay

## 2017-06-02 ENCOUNTER — Inpatient Hospital Stay (HOSPITAL_COMMUNITY): Payer: BLUE CROSS/BLUE SHIELD

## 2017-06-02 VITALS — BP 117/69 | HR 92 | Wt 135.0 lb

## 2017-06-02 DIAGNOSIS — Z3A34 34 weeks gestation of pregnancy: Secondary | ICD-10-CM | POA: Insufficient documentation

## 2017-06-02 DIAGNOSIS — Z7982 Long term (current) use of aspirin: Secondary | ICD-10-CM | POA: Insufficient documentation

## 2017-06-02 DIAGNOSIS — Z3483 Encounter for supervision of other normal pregnancy, third trimester: Secondary | ICD-10-CM

## 2017-06-02 DIAGNOSIS — O9A219 Injury, poisoning and certain other consequences of external causes complicating pregnancy, unspecified trimester: Secondary | ICD-10-CM

## 2017-06-02 DIAGNOSIS — O288 Other abnormal findings on antenatal screening of mother: Secondary | ICD-10-CM

## 2017-06-02 DIAGNOSIS — Z3480 Encounter for supervision of other normal pregnancy, unspecified trimester: Secondary | ICD-10-CM

## 2017-06-02 DIAGNOSIS — Z8759 Personal history of other complications of pregnancy, childbirth and the puerperium: Secondary | ICD-10-CM

## 2017-06-02 DIAGNOSIS — O4703 False labor before 37 completed weeks of gestation, third trimester: Secondary | ICD-10-CM

## 2017-06-02 NOTE — MAU Provider Note (Signed)
History     CSN: [id number]  Arrival date and time: 06/02/17 1558   First Provider Initiated Contact with Patient 06/02/17 1652      Chief Complaint  Patient presents with  . non-reactive NST  . Contractions   HPI Ms. Kerry Jimenez is a 21 y.o. G2P1001 at 6957w0d who presents to MAU today from the office for non-reactive NST and preterm contractions. Patient does not feel any contractions. She denies vaginal bleeding or LOF. She reports normal fetal movement. She denies complications with the pregnancy. She has not eaten much today until she arrived in MAU.   OB History    Gravida Para Term Preterm AB Living   2 1 1  0 0 1   SAB TAB Ectopic Multiple Live Births   0 0 0 0 1      Past Medical History:  Diagnosis Date  . Anemia   . Medical history non-contributory     Past Surgical History:  Procedure Laterality Date  . NO PAST SURGERIES      Family History  Problem Relation Age of Onset  . Hypertension Unknown   . Diabetes Unknown   . Cancer Paternal Grandmother     Social History   Tobacco Use  . Smoking status: Never Smoker  . Smokeless tobacco: Never Used  Substance Use Topics  . Alcohol use: Yes    Alcohol/week: 0.0 oz    Comment: occassionally, not since positive pregnancy test  . Drug use: No    Allergies: No Known Allergies  Medications Prior to Admission  Medication Sig Dispense Refill Last Dose  . aspirin 81 MG chewable tablet Chew 1 tablet (81 mg total) by mouth daily. 30 tablet 12 Past Week at Unknown time  . butalbital-acetaminophen-caffeine (FIORICET, ESGIC) 50-325-40 MG tablet Take 1-2 tablets by mouth every 6 (six) hours as needed. 45 tablet 4 Past Week at Unknown time  . cyclobenzaprine (FLEXERIL) 10 MG tablet Take 1 tablet (10 mg total) by mouth every 8 (eight) hours as needed for muscle spasms. 30 tablet 1 Past Week at Unknown time  . Prenatal-DSS-FeCb-FeGl-FA (CITRANATAL BLOOM) 90-1 MG TABS Take 1 tablet by mouth daily. 30 tablet 12  06/02/2017 at Unknown time  . Doxylamine-Pyridoxine (DICLEGIS) 10-10 MG TBEC Take 1 tablet by mouth 2 (two) times daily. (Patient not taking: Reported on 06/02/2017) 60 tablet 1 Not Taking at Unknown time  . Elastic Bandages & Supports (COMFORT FIT MATERNITY SUPP LG) MISC 1 Units by Does not apply route daily. 1 each 0 Taking    Review of Systems  Constitutional: Negative for fever.  Eyes: Negative for visual disturbance.  Cardiovascular: Negative for leg swelling.  Gastrointestinal: Negative for abdominal pain, constipation, diarrhea, nausea and vomiting.  Genitourinary: Negative for vaginal bleeding and vaginal discharge.  Neurological: Negative for headaches.   Physical Exam   Blood pressure 116/63, pulse (!) 103, temperature 98.4 F (36.9 C), temperature source Oral, resp. rate 16, last menstrual period 10/05/2016, SpO2 100 %, currently breastfeeding.  Physical Exam  Nursing note and vitals reviewed. Constitutional: She is oriented to person, place, and time. She appears well-developed and well-nourished. No distress.  HENT:  Head: Normocephalic and atraumatic.  Cardiovascular: Normal rate.  Respiratory: Effort normal.  GI: Soft. There is no tenderness.  Neurological: She is alert and oriented to person, place, and time.  Skin: Skin is warm and dry. No erythema.  Psychiatric: She has a normal mood and affect.  Dilation: Closed Effacement (%): Thick Cervical Position: Posterior  Station: [employer] Exam by:: Vonzella NippleJulie Wenzel, PA-C   Fetal Monitoring: Baseline: 130 bpm Variability: moderate Accelerations: 10 x 10, eventually 15 x 15 Decelerations: none Contractions: few, irregular, mild  MAU Course  Procedures None  MDM BPP 8/8 Reactive NST after returning from US   Assessment and Plan  A: SIUP at 4834w0d Braxton hicks contractions   P:  Discharge home Preterm labor precautions discussed Patient advised to follow-up with [facility] as scheduled or sooner  PRN Patient may return to MAU as needed or if her condition were to change or worsen   Vonzella NippleJulie Wenzel, PA-C 06/02/2017, 6:37 PM

## 2017-06-02 NOTE — Progress Notes (Signed)
   PRENATAL VISIT NOTE  Subjective:  Kerry Jimenez is a 21 y.o. G2P1001 at 1614w0d being seen today for ongoing prenatal care.  She is currently monitored for the following issues for this low-risk pregnancy and has Encounter for supervision of normal pregnancy, unspecified, unspecified trimester and History of gestational hypertension on their problem list.  Patient reports contractions since several weeks, Fell last Tuesday on her hip, was not seen anywhere, reports normal fetal movement and no LOF/Bleeding.  Contractions: Irritability. Vag. Bleeding: None.  Movement: Present. Denies leaking of fluid.   The following portions of the patient's history were reviewed and updated as appropriate: allergies, current medications, past family history, past medical history, past social history, past surgical history and problem list. Problem list updated.  Objective:   Vitals:   06/02/17 1410  BP: 117/69  Pulse: 92  Weight: 135 lb (61.2 kg)    Fetal Status: Fetal Heart Rate (bpm): 140's on NST Fundal Height: 31 cm Movement: Present     General:  Alert, oriented and cooperative. Patient is in no acute distress.  Skin: Skin is warm and dry. No rash noted.   Cardiovascular: Normal heart rate noted  Respiratory: Normal respiratory effort, no problems with respiration noted  Abdomen: Soft, gravid, appropriate for gestational age.  Pain/Pressure: Present     Pelvic: Cervical exam deferred        Extremities: Normal range of motion.  Edema: None  Mental Status:  Normal mood and affect. Normal behavior. Normal judgment and thought content.  NST: no accels, no decels, minimal variability, Cat. 2 tracing. Contractions on toco about every 3 minutes. Baseline: 140.  Mostly flat tracing.   Assessment and Plan:  Pregnancy: G2P1001 at 214w0d  1. Trauma during pregnancy       - US MFM OB FOLLOW UP; Future  2. Supervision of other normal pregnancy, antepartum      Fall in pregnancy.  Non-reactive NST      Preterm contractions.  [facility]  POC discussed with Dr. Jolayne Pantheronstant.  3. History of gestational hypertension     Taking baby ASA, normotensive no medications.   Preterm labor symptoms and general obstetric precautions including but not limited to vaginal bleeding, contractions, leaking of fluid and fetal movement were reviewed in detail with the patient. Please refer to After Visit Summary for other counseling recommendations.  Return in about 1 week (around 06/09/2017) for ROB, GBS.   Roe Coombsachelle A Denney, CNM

## 2017-06-02 NOTE — MAU Note (Signed)
Fell last Tues on the ice.  Had some cramping after fall, pain last night; no bleeding or leaking.  Was at routine appt today.  Was placed on monitor because of fall.  Contractions noted- pt unaware, tracing non-reactive.

## 2017-06-02 NOTE — Progress Notes (Addendum)
G2P1 @ [redacted] wksga. [facility] pt stating fell last week on snow. Ctx noted on monitor but pt unaware of them so office sent pt to triage for further monitoring.   Denies LOF or bleeding. +FM "states shes moving but not as much. She moves more after I eat". Pt has not eaten anything today.   EFM applied.   1710: Pt to U/S  1800: back from U/S. EFM applied   1825: provider at bs for SVE closed.   1827: d/c instructions given with pt understanding. Pt left unit via ambulatory with so.

## 2017-06-02 NOTE — Discharge Instructions (Signed)

## 2017-06-02 NOTE — Progress Notes (Signed)
Complains of intermittent cramping 8/10 x ++ months. Larey SeatFell on her right side last Tuesday in the snow/ice.

## 2017-06-10 ENCOUNTER — Encounter: Payer: BLUE CROSS/BLUE SHIELD | Admitting: Obstetrics & Gynecology

## 2017-06-10 ENCOUNTER — Ambulatory Visit (HOSPITAL_COMMUNITY): Payer: BLUE CROSS/BLUE SHIELD | Attending: Certified Nurse Midwife

## 2017-06-15 NOTE — L&D Delivery Note (Signed)
Delivery Note At 6:11 AM a viable female was delivered via Vaginal, Spontaneous (Presentation: vertex; OA). Head delivered OA. No nuchal cord present. Compound hand (posterior) delivered, followed by shoulders. Shoulder and body delivered in usual fashion. Infant placed on mother's abdomen, dried and bulb suctioned. Cord clamped x 2 after 1-minute delay, and cut by family member. Cord blood drawn. After 1 minute, the cord was clamped and cut. 40 units of pitocin diluted in 1000cc LR was infused rapidly IV.  The placenta separated spontaneously and delivered via CCT and maternal pushing effort.  It was inspected and appears to be intact with a 3 VC.    APGAR: 8, 9; weight  .   Placenta status: intact, patient requested.  Cord: 3V with the following complications: .  Cord pH: not sent.  Anesthesia: epidural  Episiotomy: None Lacerations: Periurethral Suture Repair: n/a Est. Blood Loss (mL): 100  Mom to postpartum.  Baby to Couplet care / Skin to Skin.  Alroy BailiffParker W Nickalaus Crooke 07/13/2017, 6:26 AM

## 2017-06-22 ENCOUNTER — Encounter: Payer: BLUE CROSS/BLUE SHIELD | Admitting: Certified Nurse Midwife

## 2017-06-23 ENCOUNTER — Other Ambulatory Visit (HOSPITAL_COMMUNITY)
Admission: RE | Admit: 2017-06-23 | Discharge: 2017-06-23 | Disposition: A | Discharge status: Home / Self Care | Payer: BLUE CROSS/BLUE SHIELD | Source: Ambulatory Visit | Attending: Certified Nurse Midwife | Admitting: Certified Nurse Midwife

## 2017-06-23 ENCOUNTER — Ambulatory Visit (INDEPENDENT_AMBULATORY_CARE_PROVIDER_SITE_OTHER): Payer: BLUE CROSS/BLUE SHIELD | Admitting: Certified Nurse Midwife

## 2017-06-23 VITALS — BP 111/70 | HR 103 | Wt 132.8 lb

## 2017-06-23 DIAGNOSIS — Z3A37 37 weeks gestation of pregnancy: Secondary | ICD-10-CM | POA: Diagnosis not present

## 2017-06-23 DIAGNOSIS — Z3483 Encounter for supervision of other normal pregnancy, third trimester: Secondary | ICD-10-CM | POA: Diagnosis not present

## 2017-06-23 DIAGNOSIS — Z3480 Encounter for supervision of other normal pregnancy, unspecified trimester: Secondary | ICD-10-CM

## 2017-06-23 LAB — OB RESULTS CONSOLE GC/CHLAMYDIA: Gonorrhea: NEGATIVE

## 2017-06-23 NOTE — Progress Notes (Signed)
Pt denies concerns at this time. 

## 2017-06-23 NOTE — Progress Notes (Signed)
   PRENATAL VISIT NOTE  Subjective:  Kerry Jimenez is a 22 y.o. G2P1001 at 5327w0d being seen today for ongoing prenatal care.  She is currently monitored for the following issues for this low-risk pregnancy and has Encounter for supervision of normal pregnancy, unspecified, unspecified trimester and History of gestational hypertension on their problem list.  Patient reports no complaints.  Contractions: Irregular. Vag. Bleeding: None.  Movement: Present. Denies leaking of fluid.   The following portions of the patient's history were reviewed and updated as appropriate: allergies, current medications, past family history, past medical history, past social history, past surgical history and problem list. Problem list updated.  Objective:   Vitals:   06/23/17 1506  BP: 111/70  Pulse: (!) 103  Weight: 132 lb 12.8 oz (60.2 kg)    Fetal Status: Fetal Heart Rate (bpm): 150; doppler Fundal Height: 35 cm Movement: Present  Presentation: Vertex  General:  Alert, oriented and cooperative. Patient is in no acute distress.  Skin: Skin is warm and dry. No rash noted.   Cardiovascular: Normal heart rate noted  Respiratory: Normal respiratory effort, no problems with respiration noted  Abdomen: Soft, gravid, appropriate for gestational age.  Pain/Pressure: Present     Pelvic: Cervical exam performed Dilation: 1 Effacement (%): Thick Station: -3  Extremities: Normal range of motion.  Edema: None  Mental Status:  Normal mood and affect. Normal behavior. Normal judgment and thought content.   Assessment and Plan:  Pregnancy: G2P1001 at 4927w0d  1. Supervision of other normal pregnancy, antepartum     Doing well.  - Cervicovaginal ancillary only - Strep Gp B NAA  Term labor symptoms and general obstetric precautions including but not limited to vaginal bleeding, contractions, leaking of fluid and fetal movement were reviewed in detail with the patient. Please refer to After Visit Summary for other  counseling recommendations.  Return in about 1 week (around 06/30/2017) for ROB.   Roe Coombsachelle A Jeanice Dempsey, CNM

## 2017-06-24 LAB — CERVICOVAGINAL ANCILLARY ONLY
Bacterial vaginitis: NEGATIVE
CHLAMYDIA, DNA PROBE: NEGATIVE
Candida vaginitis: POSITIVE — AB
NEISSERIA GONORRHEA: NEGATIVE
Trichomonas: NEGATIVE

## 2017-06-25 LAB — STREP GP B NAA: Strep Gp B NAA: NEGATIVE

## 2017-06-28 ENCOUNTER — Other Ambulatory Visit: Payer: Self-pay | Admitting: Certified Nurse Midwife

## 2017-06-28 ENCOUNTER — Telehealth: Payer: Self-pay

## 2017-06-28 DIAGNOSIS — B373 Candidiasis of vulva and vagina: Secondary | ICD-10-CM

## 2017-06-28 DIAGNOSIS — B3731 Acute candidiasis of vulva and vagina: Secondary | ICD-10-CM

## 2017-06-28 MED ORDER — FLUCONAZOLE 150 MG PO TABS: 150.0000 mg | ORAL_TABLET | Freq: Once | ORAL | 0 refills | Status: AC

## 2017-06-28 MED ORDER — TERCONAZOLE 0.8 % VA CREA: 1.0000 | TOPICAL_CREAM | Freq: Every day | VAGINAL | 0 refills | Status: DC

## 2017-06-28 NOTE — Telephone Encounter (Signed)
patient notified

## 2017-06-28 NOTE — Telephone Encounter (Signed)
-----   Message from Roe Coombsachelle A Denney, CNM sent at 06/28/2017  1:15 PM EST ----- Please let her know that she has a yeast infection.  Diflucan and Terconazole vaginal cream was sent to the pharmacy for her to use.  Thank you.

## 2017-06-30 ENCOUNTER — Ambulatory Visit (INDEPENDENT_AMBULATORY_CARE_PROVIDER_SITE_OTHER): Payer: BLUE CROSS/BLUE SHIELD | Admitting: Certified Nurse Midwife

## 2017-06-30 DIAGNOSIS — Z3480 Encounter for supervision of other normal pregnancy, unspecified trimester: Secondary | ICD-10-CM

## 2017-06-30 NOTE — Progress Notes (Signed)
   PRENATAL VISIT NOTE  Subjective:  Ny Milus MallickJeria Cissell is a 22 y.o. G2P1001 at 2637w0d being seen today for ongoing prenatal care.  She is currently monitored for the following issues for this low-risk pregnancy and has Encounter for supervision of normal pregnancy, unspecified, unspecified trimester and History of gestational hypertension on their problem list.  Patient reports no complaints.  Contractions: Irregular. Vag. Bleeding: None.  Movement: Present. Denies leaking of fluid.   The following portions of the patient's history were reviewed and updated as appropriate: allergies, current medications, past family history, past medical history, past social history, past surgical history and problem list. Problem list updated.  Objective:   Vitals:   06/30/17 1615  BP: 113/70  Pulse: 88  Weight: 141 lb 3.2 oz (64 kg)    Fetal Status: Fetal Heart Rate (bpm): 144; doppler Fundal Height: 36 cm Movement: Present  Presentation: Vertex  General:  Alert, oriented and cooperative. Patient is in no acute distress.  Skin: Skin is warm and dry. No rash noted.   Cardiovascular: Normal heart rate noted  Respiratory: Normal respiratory effort, no problems with respiration noted  Abdomen: Soft, gravid, appropriate for gestational age.  Pain/Pressure: Present     Pelvic: Cervical exam performed Dilation: 1 Effacement (%): 50 Station: -3  Extremities: Normal range of motion.  Edema: Trace  Mental Status:  Normal mood and affect. Normal behavior. Normal judgment and thought content.   Assessment and Plan:  Pregnancy: G2P1001 at 8237w0d  1. Supervision of other normal pregnancy, antepartum      Doing well.   Preterm labor symptoms and general obstetric precautions including but not limited to vaginal bleeding, contractions, leaking of fluid and fetal movement were reviewed in detail with the patient. Please refer to After Visit Summary for other counseling recommendations.  Return in about 1 week  (around 07/07/2017) for ROB.   Roe Coombsachelle A Asah Lamay, CNM

## 2017-06-30 NOTE — Progress Notes (Signed)
Patient reports good fetal movement with some pressure and irregular contractions. 

## 2017-07-02 ENCOUNTER — Encounter: Payer: Self-pay | Admitting: Certified Nurse Midwife

## 2017-07-07 ENCOUNTER — Encounter: Payer: Self-pay | Admitting: Certified Nurse Midwife

## 2017-07-07 ENCOUNTER — Ambulatory Visit (INDEPENDENT_AMBULATORY_CARE_PROVIDER_SITE_OTHER): Payer: BLUE CROSS/BLUE SHIELD | Admitting: Certified Nurse Midwife

## 2017-07-07 DIAGNOSIS — Z3490 Encounter for supervision of normal pregnancy, unspecified, unspecified trimester: Secondary | ICD-10-CM

## 2017-07-07 NOTE — Progress Notes (Signed)
Lost mucous plug 06/30/17. *Weight may have been entered wrong last visit  Per pt was 131lb not 141lb. Pt request Cx check.

## 2017-07-07 NOTE — Progress Notes (Signed)
   PRENATAL VISIT NOTE  Subjective:  Kerry Jimenez is a 22 y.o. G2P1001 at 219w0d being seen today for ongoing prenatal care.  She is currently monitored for the following issues for this low-risk pregnancy and has Encounter for supervision of normal pregnancy, unspecified, unspecified trimester and History of gestational hypertension on their problem list.  Patient reports no complaints.  Contractions: Not present. Vag. Bleeding: None.  Movement: Present. Denies leaking of fluid.   The following portions of the patient's history were reviewed and updated as appropriate: allergies, current medications, past family history, past medical history, past social history, past surgical history and problem list. Problem list updated.  Objective:   Vitals:   07/07/17 1558  BP: 120/68  Pulse: 84  Weight: 134 lb 3.2 oz (60.9 kg)    Fetal Status: Fetal Heart Rate (bpm): 150; doppler Fundal Height: 37 cm Movement: Present  Presentation: Vertex  General:  Alert, oriented and cooperative. Patient is in no acute distress.  Skin: Skin is warm and dry. No rash noted.   Cardiovascular: Normal heart rate noted  Respiratory: Normal respiratory effort, no problems with respiration noted  Abdomen: Soft, gravid, appropriate for gestational age.  Pain/Pressure: Absent     Pelvic: Cervical exam performed Dilation: 1 Effacement (%): 50 Station: -3  Extremities: Normal range of motion.  Edema: Trace  Mental Status:  Normal mood and affect. Normal behavior. Normal judgment and thought content.   Assessment and Plan:  Pregnancy: G2P1001 at 869w0d  1. Encounter for supervision of normal pregnancy, antepartum, unspecified gravidity     Doing well.   Term labor symptoms and general obstetric precautions including but not limited to vaginal bleeding, contractions, leaking of fluid and fetal movement were reviewed in detail with the patient. Please refer to After Visit Summary for other counseling recommendations.   Return in about 1 week (around 07/14/2017) for ROB, NST.   Roe Coombsachelle A Jedediah Noda, CNM

## 2017-07-08 ENCOUNTER — Telehealth (HOSPITAL_COMMUNITY): Payer: Self-pay | Admitting: *Deleted

## 2017-07-08 NOTE — Telephone Encounter (Signed)
Preadmission screen  

## 2017-07-13 ENCOUNTER — Inpatient Hospital Stay (HOSPITAL_COMMUNITY)
Admission: AD | Admit: 2017-07-13 | Discharge: 2017-07-14 | DRG: 807 | Disposition: A | Discharge status: Home / Self Care | Payer: BLUE CROSS/BLUE SHIELD | Source: Ambulatory Visit | Attending: Obstetrics and Gynecology | Admitting: Obstetrics and Gynecology

## 2017-07-13 ENCOUNTER — Inpatient Hospital Stay (HOSPITAL_COMMUNITY): Payer: BLUE CROSS/BLUE SHIELD | Admitting: Anesthesiology

## 2017-07-13 ENCOUNTER — Encounter (HOSPITAL_COMMUNITY): Payer: Self-pay | Admitting: *Deleted

## 2017-07-13 DIAGNOSIS — Z3A39 39 weeks gestation of pregnancy: Secondary | ICD-10-CM

## 2017-07-13 DIAGNOSIS — Z8759 Personal history of other complications of pregnancy, childbirth and the puerperium: Secondary | ICD-10-CM

## 2017-07-13 DIAGNOSIS — Z3483 Encounter for supervision of other normal pregnancy, third trimester: Secondary | ICD-10-CM | POA: Diagnosis present

## 2017-07-13 DIAGNOSIS — Z3490 Encounter for supervision of normal pregnancy, unspecified, unspecified trimester: Secondary | ICD-10-CM

## 2017-07-13 LAB — TYPE AND SCREEN
ABO/RH(D): O POS
Antibody Screen: NEGATIVE

## 2017-07-13 LAB — CBC
HEMATOCRIT: 31.3 % — AB (ref 36.0–46.0)
Hemoglobin: 10.1 g/dL — ABNORMAL LOW (ref 12.0–15.0)
MCH: 26.9 pg (ref 26.0–34.0)
MCHC: 32.3 g/dL (ref 30.0–36.0)
MCV: 83.5 fL (ref 78.0–100.0)
PLATELETS: 158 10*3/uL (ref 150–400)
RBC: 3.75 MIL/uL — ABNORMAL LOW (ref 3.87–5.11)
RDW: 14.5 % (ref 11.5–15.5)
WBC: 6.4 10*3/uL (ref 4.0–10.5)

## 2017-07-13 LAB — RPR: RPR: NONREACTIVE

## 2017-07-13 MED ORDER — LACTATED RINGERS IV SOLN: 500.0000 mL | INTRAVENOUS | Status: DC | PRN

## 2017-07-13 MED ORDER — ONDANSETRON HCL 4 MG PO TABS
4.0000 mg | ORAL_TABLET | ORAL | Status: DC | PRN
Start: 2017-07-13 — End: 2017-07-14

## 2017-07-13 MED ORDER — ACETAMINOPHEN 325 MG PO TABS: 650.0000 mg | ORAL_TABLET | ORAL | Status: DC | PRN

## 2017-07-13 MED ORDER — LACTATED RINGERS IV SOLN
500.0000 mL | Freq: Once | INTRAVENOUS | Status: AC
  Administered 2017-07-13: 500 mL via INTRAVENOUS

## 2017-07-13 MED ORDER — LACTATED RINGERS IV SOLN
INTRAVENOUS | Status: DC
  Administered 2017-07-13: 03:00:00 via INTRAVENOUS

## 2017-07-13 MED ORDER — TETANUS-DIPHTH-ACELL PERTUSSIS 5-2.5-18.5 LF-MCG/0.5 IM SUSP: 0.5000 mL | Freq: Once | INTRAMUSCULAR | Status: DC

## 2017-07-13 MED ORDER — DIBUCAINE 1 % RE OINT
1.0000 | TOPICAL_OINTMENT | RECTAL | Status: DC | PRN
Start: 2017-07-13 — End: 2017-07-14

## 2017-07-13 MED ORDER — LIDOCAINE HCL (PF) 1 % IJ SOLN
30.0000 mL | INTRAMUSCULAR | Status: DC | PRN
  Filled 2017-07-13: qty 30

## 2017-07-13 MED ORDER — OXYTOCIN 40 UNITS IN LACTATED RINGERS INFUSION - SIMPLE MED
2.5000 [IU]/h | INTRAVENOUS | Status: DC
  Filled 2017-07-13: qty 1000

## 2017-07-13 MED ORDER — ZOLPIDEM TARTRATE 5 MG PO TABS: 5.0000 mg | ORAL_TABLET | Freq: Every evening | ORAL | Status: DC | PRN

## 2017-07-13 MED ORDER — DIPHENHYDRAMINE HCL 50 MG/ML IJ SOLN
12.5000 mg | INTRAMUSCULAR | Status: DC | PRN
Start: 2017-07-13 — End: 2017-07-13
  Administered 2017-07-13: 12.5 mg via INTRAVENOUS
  Filled 2017-07-13: qty 1

## 2017-07-13 MED ORDER — EPHEDRINE 5 MG/ML INJ
10.0000 mg | INTRAVENOUS | Status: DC | PRN
  Filled 2017-07-13: qty 2

## 2017-07-13 MED ORDER — FENTANYL 2.5 MCG/ML BUPIVACAINE 1/10 % EPIDURAL INFUSION (WH - ANES)
14.0000 mL/h | INTRAMUSCULAR | Status: DC | PRN
  Administered 2017-07-13: 14 mL/h via EPIDURAL
  Filled 2017-07-13: qty 100

## 2017-07-13 MED ORDER — PHENYLEPHRINE 40 MCG/ML (10ML) SYRINGE FOR IV PUSH (FOR BLOOD PRESSURE SUPPORT)
80.0000 ug | PREFILLED_SYRINGE | INTRAVENOUS | Status: DC | PRN
  Filled 2017-07-13: qty 5

## 2017-07-13 MED ORDER — PRENATAL MULTIVITAMIN CH
1.0000 | ORAL_TABLET | Freq: Every day | ORAL | Status: DC
  Administered 2017-07-13: 1 via ORAL
  Filled 2017-07-13: qty 1

## 2017-07-13 MED ORDER — COCONUT OIL OIL: 1.0000 "application " | TOPICAL_OIL | Status: DC | PRN

## 2017-07-13 MED ORDER — OXYCODONE-ACETAMINOPHEN 5-325 MG PO TABS: 2.0000 | ORAL_TABLET | ORAL | Status: DC | PRN

## 2017-07-13 MED ORDER — ONDANSETRON HCL 4 MG/2ML IJ SOLN: 4.0000 mg | INTRAMUSCULAR | Status: DC | PRN

## 2017-07-13 MED ORDER — WITCH HAZEL-GLYCERIN EX PADS: 1.0000 "application " | MEDICATED_PAD | CUTANEOUS | Status: DC | PRN

## 2017-07-13 MED ORDER — OXYCODONE-ACETAMINOPHEN 5-325 MG PO TABS: 1.0000 | ORAL_TABLET | ORAL | Status: DC | PRN

## 2017-07-13 MED ORDER — BENZOCAINE-MENTHOL 20-0.5 % EX AERO
1.0000 "application " | INHALATION_SPRAY | CUTANEOUS | Status: DC | PRN
  Filled 2017-07-13: qty 56

## 2017-07-13 MED ORDER — OXYTOCIN BOLUS FROM INFUSION
500.0000 mL | Freq: Once | INTRAVENOUS | Status: AC
  Administered 2017-07-13: 500 mL via INTRAVENOUS

## 2017-07-13 MED ORDER — SOD CITRATE-CITRIC ACID 500-334 MG/5ML PO SOLN: 30.0000 mL | ORAL | Status: DC | PRN

## 2017-07-13 MED ORDER — IBUPROFEN 600 MG PO TABS
600.0000 mg | ORAL_TABLET | Freq: Four times a day (QID) | ORAL | Status: DC
  Administered 2017-07-13 – 2017-07-14 (×4): 600 mg via ORAL
  Filled 2017-07-13 (×4): qty 1

## 2017-07-13 MED ORDER — SENNOSIDES-DOCUSATE SODIUM 8.6-50 MG PO TABS
2.0000 | ORAL_TABLET | ORAL | Status: DC
  Administered 2017-07-14: 2 via ORAL
  Filled 2017-07-13: qty 2

## 2017-07-13 MED ORDER — DIPHENHYDRAMINE HCL 25 MG PO CAPS: 25.0000 mg | ORAL_CAPSULE | Freq: Four times a day (QID) | ORAL | Status: DC | PRN

## 2017-07-13 MED ORDER — LIDOCAINE HCL (PF) 1 % IJ SOLN
INTRAMUSCULAR | Status: DC | PRN
  Administered 2017-07-13 (×2): 5 mL

## 2017-07-13 MED ORDER — FLEET ENEMA 7-19 GM/118ML RE ENEM: 1.0000 | ENEMA | RECTAL | Status: DC | PRN

## 2017-07-13 MED ORDER — PHENYLEPHRINE 40 MCG/ML (10ML) SYRINGE FOR IV PUSH (FOR BLOOD PRESSURE SUPPORT)
80.0000 ug | PREFILLED_SYRINGE | INTRAVENOUS | Status: DC | PRN
  Filled 2017-07-13: qty 10
  Filled 2017-07-13: qty 5

## 2017-07-13 MED ORDER — ACETAMINOPHEN 325 MG PO TABS
650.0000 mg | ORAL_TABLET | ORAL | Status: DC | PRN
  Administered 2017-07-13: 650 mg via ORAL
  Filled 2017-07-13: qty 2

## 2017-07-13 MED ORDER — SIMETHICONE 80 MG PO CHEW: 80.0000 mg | CHEWABLE_TABLET | ORAL | Status: DC | PRN

## 2017-07-13 MED ORDER — ONDANSETRON HCL 4 MG/2ML IJ SOLN: 4.0000 mg | Freq: Four times a day (QID) | INTRAMUSCULAR | Status: DC | PRN

## 2017-07-13 NOTE — Anesthesia Postprocedure Evaluation (Signed)
Anesthesia Post Note  Patient: Kerry Jimenez  Procedure(s) Performed: AN AD HOC LABOR EPIDURAL     Patient location during evaluation: Mother Baby Anesthesia Type: Epidural Level of consciousness: awake Pain management: pain level controlled Vital Signs Assessment: post-procedure vital signs reviewed and stable Respiratory status: spontaneous breathing Cardiovascular status: stable Postop Assessment: no headache, no backache, epidural receding, patient able to bend at knees, no apparent nausea or vomiting and adequate PO intake Anesthetic complications: no    Last Vitals:  Vitals:   07/13/17 0920 07/13/17 1320  BP: 118/63 (!) 110/52  Pulse: 84 74  Resp: 18 18  Temp: 36.8 C 37.1 C  SpO2: 99%     Last Pain:  Vitals:   07/13/17 1320  TempSrc: Oral  PainSc: 0-No pain   Pain Goal: Patients Stated Pain Goal: 3 (07/13/17 0342)               Fanny DanceMULLINS,Edye Hainline

## 2017-07-13 NOTE — Lactation Note (Signed)
This note was copied from a baby's chart. Lactation Consultation Note  Patient Name: Kerry Jimenez: 07/13/2017 Reason for consult: Initial assessment   Initial visit at 13 hours of age. Mom reports good feedings,but she doesn't feel she has milk. Mom reports good breast changes during pregnancy and experience with older child nursing for 6 months.   Discussed normal transition of milk volume.  Mom is able to demonstrate hand expression and with minimal assist can easily express several drops of colostrum.  Mom is feeling encouraged with amount of colostrum expressed.  Lc educated mom on use of spoon for spoon feedings as needed. [facility] resources given and discussed.  Encouraged to feed with early cues on demand.  Early newborn behavior discussed.   Mom to call for assist as needed.     Maternal Data Has patient been taught Hand Expression?: Yes Does the patient have breastfeeding experience prior to this delivery?: Yes  Feeding Feeding Type: Breast Fed Length of feed: 15 min  LATCH Score                   Interventions Interventions: Breast feeding basics reviewed;Skin to skin;Position options  Lactation Tools Discussed/Used WIC Program: Yes   Consult Status Consult Status: Follow-up Jimenez: 07/14/17 Follow-up type: In-patient    Franz DellJana Elsworth Ledin 07/13/2017, 8:10 PM

## 2017-07-13 NOTE — H&P (Signed)
OBSTETRIC ADMISSION HISTORY AND PHYSICAL  Kerry Milus MallickJeria Jimenez is a 22 y.o. female G2P1001 with IUP at 2875w6d by 18wk US presenting for spontaneous onset of labor. Onset a few hours ago, reports some bloody show with cervical check. She reports +FMs, No LOF, no VB, no blurry vision, headaches or peripheral edema, and RUQ pain.  She plans on breastfeeding. She request more info for birth control. [facility]   Dating: By LMP --->  Estimated Date of Delivery: 07/14/17  Sono:  @[redacted]w[redacted]d , CWD, normal anatomy, 41% EFW  Prenatal History/Complications: Low-risk pregnancy  Past Medical History: Past Medical History:  Diagnosis Date  . Anemia   . Medical history non-contributory     Past Surgical History: Past Surgical History:  Procedure Laterality Date  . NO PAST SURGERIES      Obstetrical History: OB History    Gravida Para Term Preterm AB Living   2 1 1  0 0 1   SAB TAB Ectopic Multiple Live Births   0 0 0 0 1      Social History: Social History   Socioeconomic History  . Marital status: Single    Spouse name: None  . Number of children: 0  . Years of education: None  . Highest education level: None  Social Needs  . Financial resource strain: None  . Food insecurity - worry: None  . Food insecurity - inability: None  . Transportation needs - medical: None  . Transportation needs - non-medical: None  Occupational History  . Occupation: Dinning services  Tobacco Use  . Smoking status: Never Smoker  . Smokeless tobacco: Never Used  Substance and Sexual Activity  . Alcohol use: Yes    Alcohol/week: 0.0 oz    Comment: occassionally, not since positive pregnancy test  . Drug use: No  . Sexual activity: Yes    Partners: Male    Birth control/protection: None  Other Topics Concern  . None  Social History Narrative  . None    Family History: Family History  Problem Relation Age of Onset  . Hypertension Unknown   . Diabetes Unknown   .  Cancer Paternal Grandmother     Allergies: No Known Allergies  Medications Prior to Admission  Medication Sig Dispense Refill Last Dose  . aspirin 81 MG chewable tablet Chew 1 tablet (81 mg total) by mouth daily. (Patient not taking: Reported on 06/30/2017) 30 tablet 12 Not Taking  . butalbital-acetaminophen-caffeine (FIORICET, ESGIC) 50-325-40 MG tablet Take 1-2 tablets by mouth every 6 (six) hours as needed. (Patient not taking: Reported on 07/07/2017) 45 tablet 4 Not Taking  . cyclobenzaprine (FLEXERIL) 10 MG tablet Take 1 tablet (10 mg total) by mouth every 8 (eight) hours as needed for muscle spasms. (Patient not taking: Reported on 07/07/2017) 30 tablet 1 Not Taking  . Elastic Bandages & Supports (COMFORT FIT MATERNITY SUPP LG) MISC 1 Units by Does not apply route daily. 1 each 0 Taking  . Prenatal-DSS-FeCb-FeGl-FA (CITRANATAL BLOOM) 90-1 MG TABS Take 1 tablet by mouth daily. 30 tablet 12 Taking  . terconazole (TERAZOL 3) 0.8 % vaginal cream Place 1 applicator vaginally at bedtime. (Patient not taking: Reported on 07/07/2017) 20 g 0 Not Taking     Review of Systems   All systems reviewed and negative except as stated in HPI  Blood pressure 123/70, pulse 97, last menstrual period 10/05/2016, currently breastfeeding. General appearance: alert, cooperative, appears stated age and mild distress Lungs: clear to auscultation bilaterally Heart:  regular rate and rhythm Abdomen: soft, non-tender; bowel sounds normal Extremities: Homans sign is negative, no sign of DVT Presentation: cephalic Fetal monitoringBaseline: 135 bpm, Variability: Good {> 6 bpm), Accelerations: Reactive and Decelerations: Absent Uterine activityFrequency: Every 2-3 minutes Dilation: 6 Effacement (%): 100 Exam by:: BRIDGET , RN   Prenatal labs: ABO, Rh: O/Positive/-- (06/19 1437) Antibody: Negative (06/19 1437) Rubella: 6.26 (06/19 1437) RPR: Non Reactive (10/30 1100)  HBsAg: Negative (06/19 1437)  HIV: Non  Reactive (10/30 1100)  GBS: Negative (01/09 1643)  1 hr Glucola WNL Genetic screening  normal Anatomy US normal female  Prenatal Transfer Tool  Maternal Diabetes: No Genetic Screening: Normal Maternal Ultrasounds/Referrals: Normal Fetal Ultrasounds or other Referrals:  Referred to Materal Fetal Medicine  for nonreactive NST 12/19 Maternal Substance Abuse:  No Significant Maternal Medications:  Meds include: Other: Fiorcet Significant Maternal Lab Results: Lab values include: Group B Strep negative  No results found for this or any previous visit (from the past 24 hour(s)).  Patient Active Problem List   Diagnosis Date Noted  . Encounter for supervision of normal pregnancy, unspecified, unspecified trimester 12/01/2016  . History of gestational hypertension 12/01/2016    Assessment/Plan:  Kerry Jimenez is a 22 y.o. G2P1001 at [redacted]w[redacted]d here for SOL.  #Labor: plan for expectant management. #Pain: Requesting epidural now #FWB: reactive NST #ID: GBS neg #MOF: breast #MOC: undecided #Circ:  n/a  Alroy Bailiff, MD  07/13/2017, 3:12 AM  OB FELLOW HISTORY AND PHYSICAL ATTESTATION  I confirm that I have verified the information documented in the resident's note and that I have also personally reperformed the physical exam and all medical decision making activities. I agree with above documentation and have made edits as needed.   Caryl Ada, DO

## 2017-07-13 NOTE — Progress Notes (Signed)
Labor Progress Note Kerry Jimenez is a 22 y.o. G2P1001 at 7648w6d presented for SOL. S: Patient more comfortable since epidural placement.  O:  BP 113/63   Pulse 89   Temp 98 F (36.7 C) (Oral)   Resp 15   Ht 5' (1.524 m)   Wt 63 kg (139 lb)   LMP 10/05/2016 (LMP Unknown)   SpO2 98%   BMI 27.15 kg/m  EFM: 125/mod var/+accels, no decels  CVE: Dilation: 1 Effacement (%): 100 Station: -1 Presentation: Vertex Exam by:: Cobin Cadavid MD    A&P: 22 y.o. G2P1001 2148w6d SOL. #Labor: Progressing well. AROM at 0450. #Pain: epidural placed #FWB: reactive NST #GBS negative  Kerry BailiffParker W Worthy Boschert, MD 4:55 AM

## 2017-07-13 NOTE — Anesthesia Procedure Notes (Signed)
Epidural Patient location during procedure: OB  Staffing Anesthesiologist: Angas Isabell, MD Performed: anesthesiologist   Preanesthetic Checklist Completed: patient identified, site marked, surgical consent, pre-op evaluation, timeout performed, IV checked, risks and benefits discussed and monitors and equipment checked  Epidural Patient position: sitting Prep: DuraPrep Patient monitoring: heart rate, continuous pulse ox and blood pressure Approach: right paramedian Location: L3-L4 Injection technique: LOR saline  Needle:  Needle type: Tuohy  Needle gauge: 17 G Needle length: 9 cm and 9 Needle insertion depth: 5 cm Catheter type: closed end flexible Catheter size: 20 Guage Catheter at skin depth: 9 cm Test dose: negative  Assessment Events: blood not aspirated, injection not painful, no injection resistance, negative IV test and no paresthesia  Additional Notes Patient identified. Risks/Benefits/Options discussed with patient including but not limited to bleeding, infection, nerve damage, paralysis, failed block, incomplete pain control, headache, blood pressure changes, nausea, vomiting, reactions to medication both or allergic, itching and postpartum back pain. Confirmed with bedside nurse the patient's most recent platelet count. Confirmed with patient that they are not currently taking any anticoagulation, have any bleeding history or any family history of bleeding disorders. Patient expressed understanding and wished to proceed. All questions were answered. Sterile technique was used throughout the entire procedure. Please see nursing notes for vital signs. Test dose was given through epidural needle and negative prior to continuing to dose epidural or start infusion. Warning signs of high block given to the patient including shortness of breath, tingling/numbness in hands, complete motor block, or any concerning symptoms with instructions to call for help. Patient was given  instructions on fall risk and not to get out of bed. All questions and concerns addressed with instructions to call with any issues.     

## 2017-07-13 NOTE — Anesthesia Preprocedure Evaluation (Signed)

## 2017-07-13 NOTE — MAU Note (Signed)
PT  SAYS  UC  STRONG  SINCE 0200.   VE IN OFFICE -  1 CM  DENIES HSV AND  MRSA.   GBS-  UNSURE

## 2017-07-14 ENCOUNTER — Encounter: Payer: BLUE CROSS/BLUE SHIELD | Admitting: Certified Nurse Midwife

## 2017-07-14 MED ORDER — ACETAMINOPHEN 325 MG PO TABS: 650.0000 mg | ORAL_TABLET | ORAL | 0 refills | Status: DC | PRN

## 2017-07-14 MED ORDER — IBUPROFEN 600 MG PO TABS: 600.0000 mg | ORAL_TABLET | Freq: Four times a day (QID) | ORAL | 0 refills | Status: DC

## 2017-07-14 NOTE — Discharge Summary (Signed)
OB Discharge Summary     Patient Name: Kerry Jimenez DOB: 1996-01-10 MRN: [id number]  Date of admission: 07/13/2017 Delivering MD: Alroy Bailiff   Date of discharge: 07/14/2017  Admitting diagnosis: 39 WEEKS CTX Intrauterine pregnancy: [redacted]w[redacted]d     Secondary diagnosis:  Principal Problem:   SVD (spontaneous vaginal delivery) Active Problems:   Encounter for supervision of normal pregnancy, unspecified, unspecified trimester   History of gestational hypertension      Discharge diagnosis: Term Pregnancy Delivered                                                                                                Post partum procedures:n/a  Augmentation: AROM  Complications: None  Hospital course:  Onset of Labor With Vaginal Delivery     22 y.o. yo [id number] at [redacted]w[redacted]d was admitted in Active Labor on 07/13/2017. Patient had an uncomplicated labor course as follows:  Membrane Rupture Time/Date: 4:52 AM ,07/13/2017   Intrapartum Procedures: Episiotomy: None [1]                                         Lacerations:  Periurethral [8]  Patient had a delivery of a Viable infant. 07/13/2017  Information for the patient's newborn:  Kerry Jimenez [[id number]]  Delivery Method: Vaginal, Spontaneous(Filed from Delivery Summary)    Pateint had an uncomplicated postpartum course.  She is ambulating, tolerating a regular diet, passing flatus, and urinating well. Patient is discharged home in stable condition on 07/14/17.   Physical exam  Vitals:   07/13/17 0920 07/13/17 1320 07/13/17 2130 07/14/17 0608  BP: 118/63 (!) 110/52 114/70 115/74  Pulse: 84 74 64 62  Resp: 18 18 18 18   Temp: 98.3 F (36.8 C) 98.7 F (37.1 C) 98.7 F (37.1 C) 98.5 F (36.9 C)  TempSrc: Oral Oral Oral   SpO2: 99%     Weight:      Height:       General: alert, cooperative and no distress Lochia: appropriate Uterine Fundus: firm Incision: N/A DVT Evaluation: No evidence of DVT seen on physical  exam. Labs: Lab Results  Component Value Date   WBC 6.4 07/13/2017   HGB 10.1 (L) 07/13/2017   HCT 31.3 (L) 07/13/2017   MCV 83.5 07/13/2017   PLT 158 07/13/2017   CMP Latest Ref Rng & Units 08/23/2015  Glucose 70 - 99 mg/dL -  BUN 6 - 23 mg/dL -  Creatinine 1.30 - 8.65 mg/dL 7.84  Sodium 696 - 295 mEq/L -  Potassium 3.7 - 5.3 mEq/L -  Chloride 96 - 112 mEq/L -  CO2 19 - 32 mEq/L -  Calcium 8.4 - 10.5 mg/dL -  Total Protein 6.0 - 8.3 g/dL -  Total Bilirubin 0.3 - 1.2 mg/dL -  Alkaline Phos 39 - 284 U/L -  AST 10 - 30 U/L -  ALT 6 - 29 U/L -    Discharge instruction: per After Visit Summary and "Baby and Me Booklet".  After visit meds:  Allergies as of 07/14/2017   No Known Allergies     Medication List    STOP taking these medications   CITRANATAL BLOOM 90-1 MG Tabs   COMFORT FIT MATERNITY SUPP LG Misc   cyclobenzaprine 10 MG tablet Commonly known as:  FLEXERIL   terconazole 0.8 % vaginal cream Commonly known as:  TERAZOL 3     TAKE these medications   acetaminophen 325 MG tablet Commonly known as:  TYLENOL Take 2 tablets (650 mg total) by mouth every 4 (four) hours as needed (for pain scale < 4).   ibuprofen 600 MG tablet Commonly known as:  ADVIL,MOTRIN Take 1 tablet (600 mg total) by mouth every 6 (six) hours.       Diet: low salt diet  Activity: Advance as tolerated. Pelvic rest for 6 weeks.   Outpatient follow up:4 weeks Follow up Appt: Future Appointments  Date Time Provider Department Center  07/14/2017  4:00 PM Orvilla Cornwallenney, Rachelle A, CNM [facility] None   Follow up Visit:No Follow-up on file.  Postpartum contraception: Undecided  Newborn Data: Live born female  Birth Weight: 6 lb 12.8 oz (3085 g) APGAR: 8, 9  Newborn Delivery   Birth date/time:  07/13/2017 06:11:00 Delivery type:  Vaginal, Spontaneous     Baby Feeding: Breast Disposition:home with mother   07/14/2017 Alroy BailiffParker W Leland, MD  OB FELLOW DISCHARGE ATTESTATION I have  seen and examined this patient and agree with above documentation in the resident's note.   Patient doing well and stable at time of discharge. Blood pressures wnl.  Frederik PearJulie P Degele, MD OB Fellow 7:12 AM

## 2017-07-14 NOTE — Lactation Note (Signed)
This note was copied from a baby's chart. Lactation Consultation Note  Patient Name: Girl Ny Milus MallickJeria Hearld ZOXWR'UToday's Date: 07/14/2017    Visited with P2 Mom on day of discharge, baby 1627 hrs old.  Baby at 5% weight loss, and Mom denies any difficulty latching baby.  Encouraged STS, and feeding baby often on cue.  Goal of 8-12 feedings per 24 hrs.  Engorgement prevention and treatment discussed.  Mom aware of OP lactation support and encouraged to call prn.  Judee ClaraSmith, Cloey Sferrazza E 07/14/2017, 9:51 AM

## 2017-07-21 ENCOUNTER — Inpatient Hospital Stay (HOSPITAL_COMMUNITY): Payer: BLUE CROSS/BLUE SHIELD

## 2017-07-21 ENCOUNTER — Other Ambulatory Visit: Payer: Self-pay | Admitting: Certified Nurse Midwife

## 2017-09-16 ENCOUNTER — Encounter: Payer: Self-pay | Admitting: Certified Nurse Midwife

## 2017-09-16 ENCOUNTER — Telehealth: Payer: Self-pay | Admitting: *Deleted

## 2017-09-16 ENCOUNTER — Ambulatory Visit (INDEPENDENT_AMBULATORY_CARE_PROVIDER_SITE_OTHER): Payer: Medicaid Other | Admitting: Certified Nurse Midwife

## 2017-09-16 DIAGNOSIS — Z3202 Encounter for pregnancy test, result negative: Secondary | ICD-10-CM | POA: Diagnosis not present

## 2017-09-16 DIAGNOSIS — Z1389 Encounter for screening for other disorder: Secondary | ICD-10-CM | POA: Diagnosis not present

## 2017-09-16 DIAGNOSIS — Z3200 Encounter for pregnancy test, result unknown: Secondary | ICD-10-CM

## 2017-09-16 DIAGNOSIS — Z30011 Encounter for initial prescription of contraceptive pills: Secondary | ICD-10-CM

## 2017-09-16 LAB — POCT URINE PREGNANCY: PREG TEST UR: NEGATIVE

## 2017-09-16 MED ORDER — NORETHINDRONE 0.35 MG PO TABS: 1.0000 | ORAL_TABLET | Freq: Every day | ORAL | 11 refills | Status: DC

## 2017-09-16 NOTE — Telephone Encounter (Signed)
ERROR

## 2017-09-16 NOTE — Progress Notes (Signed)
Subjective:     Kerry Jimenez is a 22 y.o. female who presents for a postpartum visit. She is 2 months postpartum following a spontaneous vaginal delivery. I have fully reviewed the prenatal and intrapartum course. The delivery was at 39wks6days gestational weeks. Outcome: spontaneous vaginal delivery. Anesthesia: epidural and had complications from her Epidural not working according to the patient. Postpartum course has been normal. Baby's course has been normal, infant has a heart murmur. Baby is feeding by both breast and bottle - Carnation Good Start. Bleeding no bleeding. Bowel function is normal. Bladder function is normal. Patient is sexually active. Contraception method is none. Postpartum depression screening: negative.  Tobacco, alcohol and substance abuse history reviewed.  Adult immunizations reviewed including TDAP, rubella and varicella.  Patient is very upset with her delivery experience.    The following portions of the patient's history were reviewed and updated as appropriate: allergies, current medications, past family history, past medical history, past social history, past surgical history and problem list.  Review of Systems Pertinent items noted in HPI and remainder of comprehensive ROS otherwise negative.   Objective:    BP 122/80 (BP Location: Right Arm, Patient Position: Sitting, Cuff Size: Small)   Pulse 86   Temp 99 F (37.2 C) (Oral)   Resp 16   Wt 122 lb (55.3 kg)   Breastfeeding? Yes Comment: Doing formula also  BMI 23.83 kg/m   General:  alert, cooperative and no distress   Breasts:  inspection negative, no nipple discharge or bleeding, no masses or nodularity palpable  Lungs: clear to auscultation bilaterally  Heart:  regular rate and rhythm, S1, S2 normal, no murmur, click, rub or gallop  Abdomen: soft, non-tender; bowel sounds normal; no masses,  no organomegaly  Pelvic/Rectal Exam: Not performed.       Pap smear: 10/09/15: normal   50% of 20 min visit  spent on counseling and coordination of care.  Assessment:     Normal 8 week postpartum exam. Pap smear not done at today's visit.  Plan:    1. Contraception: oral progesterone-only contraceptive Rx today.  Discussed precautions for the next two weeks of back up condoms.  2.   Breast feeding status: excellent.   3. Follow up in: 2 months for annual eam or as needed.  2hr GTT for h/o GDM/screening for DM q 3 yrs per ADA recommendations Preconception counseling provided Healthy lifestyle practices reviewed

## 2018-02-15 ENCOUNTER — Inpatient Hospital Stay (HOSPITAL_COMMUNITY)
Admission: AD | Admit: 2018-02-15 | Discharge: 2018-02-16 | Disposition: A | Discharge status: Home / Self Care | Payer: Medicaid Other | Source: Ambulatory Visit | Attending: Obstetrics and Gynecology | Admitting: Obstetrics and Gynecology

## 2018-02-15 ENCOUNTER — Encounter (HOSPITAL_COMMUNITY): Payer: Self-pay

## 2018-02-15 DIAGNOSIS — O418X10 Other specified disorders of amniotic fluid and membranes, first trimester, not applicable or unspecified: Secondary | ICD-10-CM | POA: Diagnosis not present

## 2018-02-15 DIAGNOSIS — Z809 Family history of malignant neoplasm, unspecified: Secondary | ICD-10-CM | POA: Diagnosis not present

## 2018-02-15 DIAGNOSIS — Z3A01 Less than 8 weeks gestation of pregnancy: Secondary | ICD-10-CM | POA: Insufficient documentation

## 2018-02-15 DIAGNOSIS — O468X1 Other antepartum hemorrhage, first trimester: Secondary | ICD-10-CM

## 2018-02-15 DIAGNOSIS — Z79899 Other long term (current) drug therapy: Secondary | ICD-10-CM | POA: Insufficient documentation

## 2018-02-15 DIAGNOSIS — O98811 Other maternal infectious and parasitic diseases complicating pregnancy, first trimester: Secondary | ICD-10-CM | POA: Diagnosis not present

## 2018-02-15 DIAGNOSIS — O99011 Anemia complicating pregnancy, first trimester: Secondary | ICD-10-CM | POA: Diagnosis not present

## 2018-02-15 DIAGNOSIS — Z8249 Family history of ischemic heart disease and other diseases of the circulatory system: Secondary | ICD-10-CM | POA: Diagnosis not present

## 2018-02-15 DIAGNOSIS — O208 Other hemorrhage in early pregnancy: Secondary | ICD-10-CM | POA: Insufficient documentation

## 2018-02-15 DIAGNOSIS — O26899 Other specified pregnancy related conditions, unspecified trimester: Secondary | ICD-10-CM | POA: Diagnosis present

## 2018-02-15 DIAGNOSIS — R109 Unspecified abdominal pain: Secondary | ICD-10-CM | POA: Diagnosis not present

## 2018-02-15 DIAGNOSIS — Z833 Family history of diabetes mellitus: Secondary | ICD-10-CM | POA: Diagnosis not present

## 2018-02-15 DIAGNOSIS — O209 Hemorrhage in early pregnancy, unspecified: Secondary | ICD-10-CM | POA: Diagnosis not present

## 2018-02-15 DIAGNOSIS — O26891 Other specified pregnancy related conditions, first trimester: Secondary | ICD-10-CM | POA: Insufficient documentation

## 2018-02-15 DIAGNOSIS — B373 Candidiasis of vulva and vagina: Secondary | ICD-10-CM

## 2018-02-15 DIAGNOSIS — B3731 Acute candidiasis of vulva and vagina: Secondary | ICD-10-CM

## 2018-02-15 LAB — POCT PREGNANCY, URINE: Preg Test, Ur: POSITIVE — AB

## 2018-02-15 NOTE — MAU Note (Addendum)
Pt states she woke up at 7:30pm and went to the bathroom and noticed blood on the tissue. Pt reports lower abdominal cramping and back pain for the past few days. Pt has not taken anything for the pain. LMP: 01/05/2018

## 2018-02-15 NOTE — MAU Note (Signed)
Pt. Reports yeast infection that she has not received medication for. Reports waiting for appointment.

## 2018-02-15 NOTE — MAU Provider Note (Signed)
Chief Complaint: Possible Pregnancy and Vaginal Bleeding   First Provider Initiated Contact with Patient 02/15/18 2347     SUBJECTIVE HPI: Kerry Jimenez is a 22 y.o. G3P2002 at [redacted]w[redacted]d who presents to Maternity Admissions reporting abdominal pain & vaginal bleeding. Reports pink spotting on toilet paper since this afternoon. Not bleeding into pad or passing clots. Feels like she has a yeast infection b/c she gets them often, but denies vaginal discharge or irritation. Denies n/v/d, constipation, or dysuria. No recent intercourse. Last BM was yesterday.   Location: lower abdomen & back Quality: cramping Severity: 3/10 on pain scale Duration: 1 day Timing: intermittent Modifying factors: none Associated signs and symptoms: vaginal bleeding  Past Medical History:  Diagnosis Date  . Anemia    OB History  Gravida Para Term Preterm AB Living  3 2 2  0 0 2  SAB TAB Ectopic Multiple Live Births  0 0 0 0 2    # Outcome Date GA Lbr Len/2nd Weight Sex Delivery Anes PTL Lv  3 Current           2 Term 07/13/17 [redacted]w[redacted]d 03:40 / 00:19 3085 g F Vag-Spont EPI  LIV     Birth Comments: WNL  1 Term 08/26/15 [redacted]w[redacted]d 26:35 / 00:24 3184 g F Vag-Spont EPI  LIV   Past Surgical History:  Procedure Laterality Date  . NO PAST SURGERIES     Social History   Socioeconomic History  . Marital status: Single    Spouse name: Not on file  . Number of children: 0  . Years of education: Not on file  . Highest education level: Not on file  Occupational History  . Occupation: Chiropodist  Social Needs  . Financial resource strain: Not on file  . Food insecurity:    Worry: Not on file    Inability: Not on file  . Transportation needs:    Medical: Not on file    Non-medical: Not on file  Tobacco Use  . Smoking status: Never Smoker  . Smokeless tobacco: Never Used  Substance and Sexual Activity  . Alcohol use: Yes    Alcohol/week: 0.0 standard drinks    Comment: occassionally, not since positive  pregnancy test  . Drug use: No  . Sexual activity: Yes    Partners: Male    Birth control/protection: None  Lifestyle  . Physical activity:    Days per week: Not on file    Minutes per session: Not on file  . Stress: Not on file  Relationships  . Social connections:    Talks on phone: Not on file    Gets together: Not on file    Attends religious service: Not on file    Active member of club or organization: Not on file    Attends meetings of clubs or organizations: Not on file    Relationship status: Not on file  . Intimate partner violence:    Fear of current or ex partner: Not on file    Emotionally abused: Not on file    Physically abused: Not on file    Forced sexual activity: Not on file  Other Topics Concern  . Not on file  Social History Narrative  . Not on file   Family History  Problem Relation Age of Onset  . Hypertension Unknown   . Diabetes Unknown   . Cancer Paternal Grandmother    No current facility-administered medications on file prior to encounter.    Current Outpatient Medications on  File Prior to Encounter  Medication Sig Dispense Refill  . acetaminophen (TYLENOL) 325 MG tablet Take 2 tablets (650 mg total) by mouth every 4 (four) hours as needed (for pain scale < 4). 60 tablet 0  . ibuprofen (ADVIL,MOTRIN) 600 MG tablet Take 1 tablet (600 mg total) by mouth every 6 (six) hours. 60 tablet 0  . norethindrone (MICRONOR,CAMILA,ERRIN) 0.35 MG tablet Take 1 tablet (0.35 mg total) by mouth daily. 1 Package 11   No Known Allergies  I have reviewed patient's Past Medical Hx, Surgical Hx, Family Hx, Social Hx, medications and allergies.   Review of Systems  Constitutional: Negative.   Gastrointestinal: Positive for abdominal pain. Negative for constipation, diarrhea, nausea and vomiting.  Genitourinary: Positive for vaginal bleeding. Negative for dysuria and vaginal discharge.  Musculoskeletal: Positive for back pain.    OBJECTIVE Patient Vitals for  the past 24 hrs:  BP Temp Temp src Pulse Resp SpO2 Height Weight  02/15/18 2324 105/73 98.5 F (36.9 C) Oral 77 16 99 % 5' (1.524 m) 59 kg   Constitutional: Well-developed, well-nourished female in no acute distress.  Cardiovascular: normal rate & rhythm, no murmur Respiratory: normal rate and effort. Lung sounds clear throughout GI: Abd soft, non-tender, Pos BS x 4. No guarding or rebound tenderness MS: Extremities nontender, no edema, normal ROM Neurologic: Alert and oriented x 4.  GU:     SPECULUM EXAM: NEFG, moderate amount of yellow clumpy discharge  adherent to vaginal walls & cervix, no blood noted, cervix clean  BIMANUAL: No CMT. cervix closed; uterus normal size, no adnexal  tenderness or masses.    LAB RESULTS Results for orders placed or performed during the hospital encounter of 02/15/18 (from the past 24 hour(s))  Urinalysis, Routine w reflex microscopic     Status: Abnormal   Collection Time: 02/15/18 11:40 PM  Result Value Ref Range   Color, Urine YELLOW YELLOW   APPearance CLEAR CLEAR   Specific Gravity, Urine 1.025 1.005 - 1.030   pH 5.0 5.0 - 8.0   Glucose, UA NEGATIVE NEGATIVE mg/dL   Hgb urine dipstick NEGATIVE NEGATIVE   Bilirubin Urine NEGATIVE NEGATIVE   Ketones, ur 5 (A) NEGATIVE mg/dL   Protein, ur NEGATIVE NEGATIVE mg/dL   Nitrite NEGATIVE NEGATIVE   Leukocytes, UA LARGE (A) NEGATIVE   RBC / HPF 0-5 0 - 5 RBC/hpf   WBC, UA 0-5 0 - 5 WBC/hpf   Bacteria, UA NONE SEEN NONE SEEN   Squamous Epithelial / LPF 0-5 0 - 5   Mucus PRESENT   Pregnancy, urine POC     Status: Abnormal   Collection Time: 02/15/18 11:42 PM  Result Value Ref Range   Preg Test, Ur POSITIVE (A) NEGATIVE  Wet prep, genital     Status: Abnormal   Collection Time: 02/16/18 12:01 AM  Result Value Ref Range   Yeast Wet Prep HPF POC PRESENT (A) NONE SEEN   Trich, Wet Prep NONE SEEN NONE SEEN   Clue Cells Wet Prep HPF POC PRESENT (A) NONE SEEN   WBC, Wet Prep HPF POC MANY (A) NONE SEEN    Sperm NONE SEEN   CBC     Status: None   Collection Time: 02/16/18 12:04 AM  Result Value Ref Range   WBC 5.4 4.0 - 10.5 K/uL   RBC 4.76 3.87 - 5.11 MIL/uL   Hemoglobin 12.8 12.0 - 15.0 g/dL   HCT 69.6 29.5 - 28.4 %   MCV 81.9 78.0 - 100.0 fL  MCH 26.9 26.0 - 34.0 pg   MCHC 32.8 30.0 - 36.0 g/dL   RDW 54.0 98.1 - 19.1 %   Platelets 282 150 - 400 K/uL  hCG, quantitative, pregnancy     Status: Abnormal   Collection Time: 02/16/18 12:04 AM  Result Value Ref Range   hCG, Beta Chain, Quant, S 10,586 (H) <5 mIU/mL    IMAGING No results found.  MAU COURSE Orders Placed This Encounter  Procedures  . Wet prep, genital  . US OB LESS THAN 14 WEEKS WITH OB TRANSVAGINAL  . Urinalysis, Routine w reflex microscopic  . CBC  . hCG, quantitative, pregnancy  . Pregnancy, urine POC  . Discharge patient   Meds ordered this encounter  Medications  . terconazole (TERAZOL 7) 0.4 % vaginal cream    Sig: Place 1 applicator vaginally at bedtime. Use for seven days    Dispense:  45 g    Refill:  0    Order Specific Question:   Supervising Provider    Answer:   CONSTANT, PEGGY [[id number]]    MDM +UPT UA, wet prep, GC/chlamydia, CBC, ABO/Rh, quant hCG, and [place] today to rule out ectopic pregnancy O positive Ultrasound shows IUGS with YS & small SCH Wet prep + yeast  ASSESSMENT 1. Vaginal yeast infection   2. Vaginal bleeding in pregnancy, first trimester   3. Abdominal cramping affecting pregnancy   4. Subchorionic hematoma in first trimester, single or unspecified fetus     PLAN Discharge home in stable condition. Bleeding vs SAB precautions  Allergies as of 02/16/2018   No Known Allergies     Medication List    STOP taking these medications   ibuprofen 600 MG tablet Commonly known as:  ADVIL,MOTRIN   norethindrone 0.35 MG tablet Commonly known as:  MICRONOR,CAMILA,ERRIN     TAKE these medications   acetaminophen 325 MG tablet Commonly known as:  TYLENOL Take 2 tablets (650  mg total) by mouth every 4 (four) hours as needed (for pain scale < 4).   terconazole 0.4 % vaginal cream Commonly known as:  TERAZOL 7 Place 1 applicator vaginally at bedtime. Use for seven days        Judeth Horn, NP 02/16/2018  1:27 AM

## 2018-02-16 ENCOUNTER — Inpatient Hospital Stay (HOSPITAL_COMMUNITY): Payer: Medicaid Other

## 2018-02-16 LAB — URINALYSIS, ROUTINE W REFLEX MICROSCOPIC
BACTERIA UA: NONE SEEN
Bilirubin Urine: NEGATIVE
GLUCOSE, UA: NEGATIVE mg/dL
HGB URINE DIPSTICK: NEGATIVE
Ketones, ur: 5 mg/dL — AB
NITRITE: NEGATIVE
PH: 5 (ref 5.0–8.0)
Protein, ur: NEGATIVE mg/dL
SPECIFIC GRAVITY, URINE: 1.025 (ref 1.005–1.030)

## 2018-02-16 LAB — CBC
HCT: 39 % (ref 36.0–46.0)
Hemoglobin: 12.8 g/dL (ref 12.0–15.0)
MCH: 26.9 pg (ref 26.0–34.0)
MCHC: 32.8 g/dL (ref 30.0–36.0)
MCV: 81.9 fL (ref 78.0–100.0)
PLATELETS: 282 10*3/uL (ref 150–400)
RBC: 4.76 MIL/uL (ref 3.87–5.11)
RDW: 14.1 % (ref 11.5–15.5)
WBC: 5.4 10*3/uL (ref 4.0–10.5)

## 2018-02-16 LAB — WET PREP, GENITAL
Sperm: NONE SEEN
Trich, Wet Prep: NONE SEEN

## 2018-02-16 LAB — HCG, QUANTITATIVE, PREGNANCY: hCG, Beta Chain, Quant, S: 10586 m[IU]/mL — ABNORMAL HIGH (ref <5)

## 2018-02-16 MED ORDER — TERCONAZOLE 0.4 % VA CREA: 1.0000 | TOPICAL_CREAM | Freq: Every day | VAGINAL | 0 refills | Status: DC

## 2018-02-16 NOTE — Discharge Instructions (Signed)
Probiotics with lactobacillus   Vaginal Yeast infection, Adult Vaginal yeast infection is a condition that causes soreness, swelling, and redness (inflammation) of the vagina. It also causes vaginal discharge. This is a common condition. Some women get this infection frequently. What are the causes? This condition is caused by a change in the normal balance of the yeast (candida) and bacteria that live in the vagina. This change causes an overgrowth of yeast, which causes the inflammation. What increases the risk? This condition is more likely to develop in:  Women who take antibiotic medicines.  Women who have diabetes.  Women who take birth control pills.  Women who are pregnant.  Women who douche often.  Women who have a weak defense (immune) system.  Women who have been taking steroid medicines for a long time.  Women who frequently wear tight clothing.  What are the signs or symptoms? Symptoms of this condition include:  White, thick vaginal discharge.  Swelling, itching, redness, and irritation of the vagina. The lips of the vagina (vulva) may be affected as well.  Pain or a burning feeling while urinating.  Pain during sex.  How is this diagnosed? This condition is diagnosed with a medical history and physical exam. This will include a pelvic exam. Your health care provider will examine a sample of your vaginal discharge under a microscope. Your health care provider may send this sample for testing to confirm the diagnosis. How is this treated? This condition is treated with medicine. Medicines may be over-the-counter or prescription. You may be told to use one or more of the following:  Medicine that is taken orally.  Medicine that is applied as a cream.  Medicine that is inserted directly into the vagina (suppository).  Follow these instructions at home:  Take or apply over-the-counter and prescription medicines only as told by your health care  provider.  Do not have sex until your health care provider has approved. Tell your sex partner that you have a yeast infection. That person should go to his or her health care provider if he or she develops symptoms.  Do not wear tight clothes, such as pantyhose or tight pants.  Avoid using tampons until your health care provider approves.  Eat more yogurt. This may help to keep your yeast infection from returning.  Try taking a sitz bath to help with discomfort. This is a warm water bath that is taken while you are sitting down. The water should only come up to your hips and should cover your buttocks. Do this 3-4 times per day or as told by your health care provider.  Do not douche.  Wear breathable, cotton underwear.  If you have diabetes, keep your blood sugar levels under control. Contact a health care provider if:  You have a fever.  Your symptoms go away and then return.  Your symptoms do not get better with treatment.  Your symptoms get worse.  You have new symptoms.  You develop blisters in or around your vagina.  You have blood coming from your vagina and it is not your menstrual period.  You develop pain in your abdomen. This information is not intended to replace advice given to you by your health care provider. Make sure you discuss any questions you have with your health care provider. Document Released: 03/11/2005 Document Revised: 11/13/2015 Document Reviewed: 12/03/2014 Elsevier Interactive Patient Education  2018 [employer]     Subchorionic Hematoma A subchorionic hematoma is a gathering of blood between the  outer wall of the placenta and the inner wall of the womb (uterus). The placenta is the organ that connects the fetus to the wall of the uterus. The placenta performs the feeding, breathing (oxygen to the fetus), and waste removal (excretory work) of the fetus. Subchorionic hematoma is the most common abnormality found on a result from  ultrasonography done during the first trimester or early second trimester of pregnancy. If there has been little or no vaginal bleeding, early small hematomas usually shrink on their own and do not affect your baby or pregnancy. The blood is gradually absorbed over 1-2 weeks. When bleeding starts later in pregnancy or the hematoma is larger or occurs in an older pregnant woman, the outcome may not be as good. Larger hematomas may get bigger, which increases the chances for miscarriage. Subchorionic hematoma also increases the risk of premature detachment of the placenta from the uterus, preterm (premature) labor, and stillbirth. Follow these instructions at home:  Stay on bed rest if your health care provider recommends this. Although bed rest will not prevent more bleeding or prevent a miscarriage, your health care provider may recommend bed rest until you are advised otherwise.  Avoid heavy lifting (more than 10 lb [4.5 kg]), exercise, sexual intercourse, or douching as directed by your health care provider.  Keep track of the number of pads you use each day and how soaked (saturated) they are. Write down this information.  Do not use tampons.  Keep all follow-up appointments as directed by your health care provider. Your health care provider may ask you to have follow-up blood tests or ultrasound tests or both. Get help right away if:  You have severe cramps in your stomach, back, abdomen, or pelvis.  You have a fever.  You pass large clots or tissue. Save any tissue for your health care provider to look at.  Your bleeding increases or you become lightheaded, feel weak, or have fainting episodes. This information is not intended to replace advice given to you by your health care provider. Make sure you discuss any questions you have with your health care provider. Document Released: 09/16/2006 Document Revised: 11/07/2015 Document Reviewed: 12/29/2012 Elsevier Interactive Patient Education   2017 [employer].

## 2018-02-17 LAB — GC/CHLAMYDIA PROBE AMP (~~LOC~~) NOT AT ARMC
Chlamydia: NEGATIVE
Neisseria Gonorrhea: NEGATIVE

## 2018-03-29 DIAGNOSIS — Z3480 Encounter for supervision of other normal pregnancy, unspecified trimester: Secondary | ICD-10-CM | POA: Insufficient documentation

## 2018-03-30 ENCOUNTER — Encounter: Payer: Medicaid Other | Admitting: Certified Nurse Midwife

## 2018-04-12 ENCOUNTER — Ambulatory Visit (INDEPENDENT_AMBULATORY_CARE_PROVIDER_SITE_OTHER): Payer: Medicaid Other | Admitting: Obstetrics

## 2018-04-12 ENCOUNTER — Encounter: Payer: Self-pay | Admitting: Obstetrics

## 2018-04-12 VITALS — BP 114/74 | HR 74 | Wt 136.6 lb

## 2018-04-12 DIAGNOSIS — Z3481 Encounter for supervision of other normal pregnancy, first trimester: Secondary | ICD-10-CM | POA: Diagnosis not present

## 2018-04-12 DIAGNOSIS — Z3480 Encounter for supervision of other normal pregnancy, unspecified trimester: Secondary | ICD-10-CM

## 2018-04-12 DIAGNOSIS — O219 Vomiting of pregnancy, unspecified: Secondary | ICD-10-CM

## 2018-04-12 MED ORDER — DOXYLAMINE-PYRIDOXINE 10-10 MG PO TBEC: DELAYED_RELEASE_TABLET | ORAL | 5 refills | Status: DC

## 2018-04-12 NOTE — Progress Notes (Addendum)
NOB labs only today.  Will see physician on next visit.

## 2018-04-12 NOTE — Progress Notes (Signed)
Patient was here for Initial OB visit - was not seen due to requesting female provider. Patient was originally scheduled on 03/30/18 with female provider but rescheduled with female provider.   All new OB labs done - patient will receive PAP at next appointment. Patient request medication refill on Doxylamine Pyridoxine (Diclegis) for nausea and vomiting.  Patient declined Influenza vaccine.

## 2018-04-15 LAB — CULTURE, OB URINE

## 2018-04-15 LAB — URINE CULTURE, OB REFLEX: Organism ID, Bacteria: NO GROWTH

## 2018-04-18 ENCOUNTER — Encounter: Payer: Self-pay | Admitting: Obstetrics

## 2018-04-20 LAB — HEMOGLOBINOPATHY EVALUATION
HEMOGLOBIN A2 QUANTITATION: 2.1 % (ref 1.8–3.2)
HEMOGLOBIN F QUANTITATION: 0 % (ref 0.0–2.0)
HGB A: 97.9 % (ref 96.4–98.8)
HGB C: 0 %
HGB S: 0 %
HGB VARIANT: 0 %

## 2018-04-20 LAB — OBSTETRIC PANEL, INCLUDING HIV
Antibody Screen: NEGATIVE
Basophils Absolute: 0 10*3/uL (ref 0.0–0.2)
Basos: 1 %
EOS (ABSOLUTE): 0.1 10*3/uL (ref 0.0–0.4)
Eos: 1 %
HEMOGLOBIN: 10.3 g/dL — AB (ref 11.1–15.9)
HIV Screen 4th Generation wRfx: NONREACTIVE
Hematocrit: 32.6 % — ABNORMAL LOW (ref 34.0–46.6)
Hepatitis B Surface Ag: NEGATIVE
IMMATURE GRANS (ABS): 0.2 10*3/uL — AB (ref 0.0–0.1)
IMMATURE GRANULOCYTES: 3 %
LYMPHS ABS: 2.1 10*3/uL (ref 0.7–3.1)
LYMPHS: 32 %
MCH: 26 pg — AB (ref 26.6–33.0)
MCHC: 31.6 g/dL (ref 31.5–35.7)
MCV: 82 fL (ref 79–97)
MONOS ABS: 0.5 10*3/uL (ref 0.1–0.9)
Monocytes: 7 %
NEUTROS PCT: 56 %
Neutrophils Absolute: 3.6 10*3/uL (ref 1.4–7.0)
Platelets: 216 10*3/uL (ref 150–450)
RBC: 3.96 x10E6/uL (ref 3.77–5.28)
RDW: 14.4 % (ref 12.3–15.4)
RPR Ser Ql: NONREACTIVE
Rh Factor: POSITIVE
Rubella Antibodies, IGG: 5.52 index (ref >0.99)
WBC: 6.4 10*3/uL (ref 3.4–10.8)

## 2018-04-20 LAB — CYSTIC FIBROSIS MUTATION 97: Interpretation: NOT DETECTED

## 2018-04-21 ENCOUNTER — Other Ambulatory Visit: Payer: Self-pay | Admitting: Obstetrics

## 2018-04-21 DIAGNOSIS — D508 Other iron deficiency anemias: Secondary | ICD-10-CM

## 2018-04-21 MED ORDER — FERROUS SULFATE 325 (65 FE) MG PO TABS: 325.0000 mg | ORAL_TABLET | Freq: Two times a day (BID) | ORAL | 5 refills | Status: DC

## 2018-04-26 ENCOUNTER — Other Ambulatory Visit: Payer: Self-pay

## 2018-04-26 MED ORDER — TERCONAZOLE 0.8 % VA CREA: 1.0000 | TOPICAL_CREAM | Freq: Every day | VAGINAL | 0 refills | Status: DC

## 2018-04-26 NOTE — Progress Notes (Signed)
Pt c/o another yeast infection. She prefers Terconazole. Rx sent to her [facility] per protocol.

## 2018-04-29 ENCOUNTER — Ambulatory Visit (INDEPENDENT_AMBULATORY_CARE_PROVIDER_SITE_OTHER): Payer: Medicaid Other | Admitting: Obstetrics and Gynecology

## 2018-04-29 ENCOUNTER — Encounter: Payer: Self-pay | Admitting: Obstetrics and Gynecology

## 2018-04-29 DIAGNOSIS — O09299 Supervision of pregnancy with other poor reproductive or obstetric history, unspecified trimester: Secondary | ICD-10-CM

## 2018-04-29 DIAGNOSIS — O09899 Supervision of other high risk pregnancies, unspecified trimester: Secondary | ICD-10-CM | POA: Insufficient documentation

## 2018-04-29 DIAGNOSIS — Z3480 Encounter for supervision of other normal pregnancy, unspecified trimester: Secondary | ICD-10-CM

## 2018-04-29 DIAGNOSIS — Z3482 Encounter for supervision of other normal pregnancy, second trimester: Secondary | ICD-10-CM

## 2018-04-29 DIAGNOSIS — O09892 Supervision of other high risk pregnancies, second trimester: Secondary | ICD-10-CM

## 2018-04-29 DIAGNOSIS — O09292 Supervision of pregnancy with other poor reproductive or obstetric history, second trimester: Secondary | ICD-10-CM

## 2018-04-29 MED ORDER — ASPIRIN EC 81 MG PO TBEC: 81.0000 mg | DELAYED_RELEASE_TABLET | Freq: Every day | ORAL | 2 refills | Status: DC

## 2018-04-29 NOTE — Progress Notes (Signed)
Subjective:    Kerry Jimenez is a [id number] [redacted]w[redacted]d being seen today for her first obstetrical visit.  Her obstetrical history is significant for history of preeclampsia in first pregnancy. Patient does intend to breast feed. Pregnancy history fully reviewed.  Patient reports no complaints.  Vitals:   04/29/18 0931  BP: 117/73  Pulse: 85  Weight: 134 lb (60.8 kg)    HISTORY: OB History  Gravida Para Term Preterm AB Living  3 2 2  0 0 2  SAB TAB Ectopic Multiple Live Births  0 0 0 0 2    # Outcome Date GA Lbr Len/2nd Weight Sex Delivery Anes PTL Lv  3 Current           2 Term 07/13/17 [redacted]w[redacted]d 03:40 / 00:19 6 lb 12.8 oz (3.085 kg) F Vag-Spont EPI  LIV     Birth Comments: WNL  1 Term 08/26/15 [redacted]w[redacted]d 26:35 / 00:24 7 lb 0.3 oz (3.184 kg) F Vag-Spont EPI  LIV   Past Medical History:  Diagnosis Date  . Anemia    Past Surgical History:  Procedure Laterality Date  . NO PAST SURGERIES     Family History  Problem Relation Age of Onset  . Diabetes Mother   . Hypertension Mother   . Hyperlipidemia Mother   . Arthritis Mother   . Sickle cell anemia Mother   . Hypertension Unknown   . Diabetes Unknown   . Cancer Paternal Grandmother   . Sickle cell anemia Brother   . Arthritis Maternal Aunt   . Heart disease Maternal Uncle   . Heart disease Paternal Aunt   . Sickle cell trait Brother   . Hyperlipidemia Sister   . Arthritis Maternal Aunt   . Hyperlipidemia Maternal Aunt   . Diabetes Paternal Aunt   . Diabetes Paternal Aunt      Exam    Uterus:     Pelvic Exam:    Perineum: No Hemorrhoids, Normal Perineum   Vulva: normal   Vagina:  normal mucosa, normal discharge   pH:    Cervix: multiparous appearance and cervic is closed and long   Adnexa: normal adnexa and no mass, fullness, tenderness   Bony Pelvis: gynecoid  System: Breast:  normal appearance, no masses or tenderness   Skin: normal coloration and turgor, no rashes    Neurologic: oriented, no focal deficits   Extremities: normal strength, tone, and muscle mass   HEENT extra ocular movement intact   Mouth/Teeth mucous membranes moist, pharynx normal without lesions and dental hygiene good   Neck supple and no masses   Cardiovascular: regular rate and rhythm   Respiratory:  appears well, vitals normal, no respiratory distress, acyanotic, normal RR, chest clear, no wheezing, crepitations, rhonchi, normal symmetric air entry   Abdomen: soft, non-tender; bowel sounds normal; no masses,  no organomegaly   Urinary:       Assessment:    Pregnancy: [id number] Patient Active Problem List   Diagnosis Date Noted  . Hx of preeclampsia, prior pregnancy, currently pregnant 04/29/2018  . Short interval between pregnancies affecting pregnancy, antepartum 04/29/2018  . Supervision of other normal pregnancy, antepartum 03/29/2018        Plan:     Initial labs drawn. Prenatal vitamins. Problem list reviewed and updated. Genetic Screening discussed : panorama reviewed.  Ultrasound discussed; fetal survey: ordered. Baseline labs ordered Patient advised to start ASA AFP today  Follow up in 4 weeks. 50% of 30 min visit spent on counseling and coordination  of care.     Arlie Riker 04/29/2018

## 2018-04-29 NOTE — Patient Instructions (Signed)
 Second Trimester of Pregnancy The second trimester is from week 14 through week 27 (months 4 through 6). The second trimester is often a time when you feel your best. Your body has adjusted to being pregnant, and you begin to feel better physically. Usually, morning sickness has lessened or quit completely, you may have more energy, and you may have an increase in appetite. The second trimester is also a time when the fetus is growing rapidly. At the end of the sixth month, the fetus is about 9 inches long and weighs about 1 pounds. You will likely begin to feel the baby move (quickening) between 16 and 20 weeks of pregnancy. Body changes during your second trimester Your body continues to go through many changes during your second trimester. The changes vary from woman to woman.  Your weight will continue to increase. You will notice your lower abdomen bulging out.  You may begin to get stretch marks on your hips, abdomen, and breasts.  You may develop headaches that can be relieved by medicines. The medicines should be approved by your health care provider.  You may urinate more often because the fetus is pressing on your bladder.  You may develop or continue to have heartburn as a result of your pregnancy.  You may develop constipation because certain hormones are causing the muscles that push waste through your intestines to slow down.  You may develop hemorrhoids or swollen, bulging veins (varicose veins).  You may have back pain. This is caused by: ? Weight gain. ? Pregnancy hormones that are relaxing the joints in your pelvis. ? A shift in weight and the muscles that support your balance.  Your breasts will continue to grow and they will continue to become tender.  Your gums may bleed and may be sensitive to brushing and flossing.  Dark spots or blotches (chloasma, mask of pregnancy) may develop on your face. This will likely fade after the baby is born.  A dark line from  your belly button to the pubic area (linea nigra) may appear. This will likely fade after the baby is born.  You may have changes in your hair. These can include thickening of your hair, rapid growth, and changes in texture. Some women also have hair loss during or after pregnancy, or hair that feels dry or thin. Your hair will most likely return to normal after your baby is born.  What to expect at prenatal visits During a routine prenatal visit:  You will be weighed to make sure you and the fetus are growing normally.  Your blood pressure will be taken.  Your abdomen will be measured to track your baby's growth.  The fetal heartbeat will be listened to.  Any test results from the previous visit will be discussed.  Your health care provider may ask you:  How you are feeling.  If you are feeling the baby move.  If you have had any abnormal symptoms, such as leaking fluid, bleeding, severe headaches, or abdominal cramping.  If you are using any tobacco products, including cigarettes, chewing tobacco, and electronic cigarettes.  If you have any questions.  Other tests that may be performed during your second trimester include:  Blood tests that check for: ? Low iron levels (anemia). ? High blood sugar that affects pregnant women (gestational diabetes) between 24 and 28 weeks. ? Rh antibodies. This is to check for a protein on red blood cells (Rh factor).  Urine tests to check for infections, diabetes,   or protein in the urine.  An ultrasound to confirm the proper growth and development of the baby.  An amniocentesis to check for possible genetic problems.  Fetal screens for spina bifida and Down syndrome.  HIV (human immunodeficiency virus) testing. Routine prenatal testing includes screening for HIV, unless you choose not to have this test.  Follow these instructions at home: Medicines  Follow your health care provider's instructions regarding medicine use. Specific  medicines may be either safe or unsafe to take during pregnancy.  Take a prenatal vitamin that contains at least 600 micrograms (mcg) of folic acid.  If you develop constipation, try taking a stool softener if your health care provider approves. Eating and drinking  Eat a balanced diet that includes fresh fruits and vegetables, whole grains, good sources of protein such as meat, eggs, or tofu, and low-fat dairy. Your health care provider will help you determine the amount of weight gain that is right for you.  Avoid raw meat and uncooked cheese. These carry germs that can cause birth defects in the baby.  If you have low calcium intake from food, talk to your health care provider about whether you should take a daily calcium supplement.  Limit foods that are high in fat and processed sugars, such as fried and sweet foods.  To prevent constipation: ? Drink enough fluid to keep your urine clear or pale yellow. ? Eat foods that are high in fiber, such as fresh fruits and vegetables, whole grains, and beans. Activity  Exercise only as directed by your health care provider. Most women can continue their usual exercise routine during pregnancy. Try to exercise for 30 minutes at least 5 days a week. Stop exercising if you experience uterine contractions.  Avoid heavy lifting, wear low heel shoes, and practice good posture.  A sexual relationship may be continued unless your health care provider directs you otherwise. Relieving pain and discomfort  Wear a good support bra to prevent discomfort from breast tenderness.  Take warm sitz baths to soothe any pain or discomfort caused by hemorrhoids. Use hemorrhoid cream if your health care provider approves.  Rest with your legs elevated if you have leg cramps or low back pain.  If you develop varicose veins, wear support hose. Elevate your feet for 15 minutes, 3-4 times a day. Limit salt in your diet. Prenatal Care  Write down your questions.  Take them to your prenatal visits.  Keep all your prenatal visits as told by your health care provider. This is important. Safety  Wear your seat belt at all times when driving.  Make a list of emergency phone numbers, including numbers for family, friends, the hospital, and police and fire departments. General instructions  Ask your health care provider for a referral to a local prenatal education class. Begin classes no later than the beginning of month 6 of your pregnancy.  Ask for help if you have counseling or nutritional needs during pregnancy. Your health care provider can offer advice or refer you to specialists for help with various needs.  Do not use hot tubs, steam rooms, or saunas.  Do not douche or use tampons or scented sanitary pads.  Do not cross your legs for long periods of time.  Avoid cat litter boxes and soil used by cats. These carry germs that can cause birth defects in the baby and possibly loss of the fetus by miscarriage or stillbirth.  Avoid all smoking, herbs, alcohol, and unprescribed drugs. Chemicals in these products   can affect the formation and growth of the baby.  Do not use any products that contain nicotine or tobacco, such as cigarettes and e-cigarettes. If you need help quitting, ask your health care provider.  Visit your dentist if you have not gone yet during your pregnancy. Use a soft toothbrush to brush your teeth and be gentle when you floss. Contact a health care provider if:  You have dizziness.  You have mild pelvic cramps, pelvic pressure, or nagging pain in the abdominal area.  You have persistent nausea, vomiting, or diarrhea.  You have a bad smelling vaginal discharge.  You have pain when you urinate. Get help right away if:  You have a fever.  You are leaking fluid from your vagina.  You have spotting or bleeding from your vagina.  You have severe abdominal cramping or pain.  You have rapid weight gain or weight  loss.  You have shortness of breath with chest pain.  You notice sudden or extreme swelling of your face, hands, ankles, feet, or legs.  You have not felt your baby move in over an hour.  You have severe headaches that do not go away when you take medicine.  You have vision changes. Summary  The second trimester is from week 14 through week 27 (months 4 through 6). It is also a time when the fetus is growing rapidly.  Your body goes through many changes during pregnancy. The changes vary from woman to woman.  Avoid all smoking, herbs, alcohol, and unprescribed drugs. These chemicals affect the formation and growth your baby.  Do not use any tobacco products, such as cigarettes, chewing tobacco, and e-cigarettes. If you need help quitting, ask your health care provider.  Contact your health care provider if you have any questions. Keep all prenatal visits as told by your health care provider. This is important. This information is not intended to replace advice given to you by your health care provider. Make sure you discuss any questions you have with your health care provider. Document Released: 05/26/2001 Document Revised: 07/07/2016 Document Reviewed: 07/07/2016 [employer] Interactive Patient Education  2018 [employer]   Contraception Choices Contraception, also called birth control, refers to methods or devices that prevent pregnancy. Hormonal methods Contraceptive implant A contraceptive implant is a thin, plastic tube that contains a hormone. It is inserted into the upper part of the arm. It can remain in place for up to 3 years. Progestin-only injections Progestin-only injections are injections of progestin, a synthetic form of the hormone progesterone. They are given every 3 months by a health care provider. Birth control pills Birth control pills are pills that contain hormones that prevent pregnancy. They must be taken once a day, preferably at the same time each  day. Birth control patch The birth control patch contains hormones that prevent pregnancy. It is placed on the skin and must be changed once a week for three weeks and removed on the fourth week. A prescription is needed to use this method of contraception. Vaginal ring A vaginal ring contains hormones that prevent pregnancy. It is placed in the vagina for three weeks and removed on the fourth week. After that, the process is repeated with a new ring. A prescription is needed to use this method of contraception. Emergency contraceptive Emergency contraceptives prevent pregnancy after unprotected sex. They come in pill form and can be taken up to 5 days after sex. They work best the sooner they are taken after having sex. Most emergency   contraceptives are available without a prescription. This method should not be used as your only form of birth control. Barrier methods Female condom A female condom is a thin sheath that is worn over the penis during sex. Condoms keep sperm from going inside a woman's body. They can be used with a spermicide to increase their effectiveness. They should be disposed after a single use. Female condom A female condom is a soft, loose-fitting sheath that is put into the vagina before sex. The condom keeps sperm from going inside a woman's body. They should be disposed after a single use. Diaphragm A diaphragm is a soft, dome-shaped barrier. It is inserted into the vagina before sex, along with a spermicide. The diaphragm blocks sperm from entering the uterus, and the spermicide kills sperm. A diaphragm should be left in the vagina for 6-8 hours after sex and removed within 24 hours. A diaphragm is prescribed and fitted by a health care provider. A diaphragm should be replaced every 1-2 years, after giving birth, after gaining more than 15 lb (6.8 kg), and after pelvic surgery. Cervical cap A cervical cap is a round, soft latex or plastic cup that fits over the cervix. It is  inserted into the vagina before sex, along with spermicide. It blocks sperm from entering the uterus. The cap should be left in place for 6-8 hours after sex and removed within 48 hours. A cervical cap must be prescribed and fitted by a health care provider. It should be replaced every 2 years. Sponge A sponge is a soft, circular piece of polyurethane foam with spermicide on it. The sponge helps block sperm from entering the uterus, and the spermicide kills sperm. To use it, you make it wet and then insert it into the vagina. It should be inserted before sex, left in for at least 6 hours after sex, and removed and thrown away within 30 hours. Spermicides Spermicides are chemicals that kill or block sperm from entering the cervix and uterus. They can come as a cream, jelly, suppository, foam, or tablet. A spermicide should be inserted into the vagina with an applicator at least 10-15 minutes before sex to allow time for it to work. The process must be repeated every time you have sex. Spermicides do not require a prescription. Intrauterine contraception Intrauterine device (IUD) An IUD is a T-shaped device that is put in a woman's uterus. There are two types:  Hormone IUD.This type contains progestin, a synthetic form of the hormone progesterone. This type can stay in place for 3-5 years.  Copper IUD.This type is wrapped in copper wire. It can stay in place for 10 years.  Permanent methods of contraception Female tubal ligation In this method, a woman's fallopian tubes are sealed, tied, or blocked during surgery to prevent eggs from traveling to the uterus. Hysteroscopic sterilization In this method, a small, flexible insert is placed into each fallopian tube. The inserts cause scar tissue to form in the fallopian tubes and block them, so sperm cannot reach an egg. The procedure takes about 3 months to be effective. Another form of birth control must be used during those 3 months. Female  sterilization This is a procedure to tie off the tubes that carry sperm (vasectomy). After the procedure, the man can still ejaculate fluid (semen). Natural planning methods Natural family planning In this method, a couple does not have sex on days when the woman could become pregnant. Calendar method This means keeping track of the length of each   menstrual cycle, identifying the days when pregnancy can happen, and not having sex on those days. Ovulation method In this method, a couple avoids sex during ovulation. Symptothermal method This method involves not having sex during ovulation. The woman typically checks for ovulation by watching changes in her temperature and in the consistency of cervical mucus. Post-ovulation method In this method, a couple waits to have sex until after ovulation. Summary  Contraception, also called birth control, means methods or devices that prevent pregnancy.  Hormonal methods of contraception include implants, injections, pills, patches, vaginal rings, and emergency contraceptives.  Barrier methods of contraception can include female condoms, female condoms, diaphragms, cervical caps, sponges, and spermicides.  There are two types of IUDs (intrauterine devices). An IUD can be put in a woman's uterus to prevent pregnancy for 3-5 years.  Permanent sterilization can be done through a procedure for males, females, or both.  Natural family planning methods involve not having sex on days when the woman could become pregnant. This information is not intended to replace advice given to you by your health care provider. Make sure you discuss any questions you have with your health care provider. Document Released: 06/01/2005 Document Revised: 07/04/2016 Document Reviewed: 07/04/2016 [employer] Interactive Patient Education  2018 [employer]   Breastfeeding Choosing to breastfeed is one of the best decisions you can make for yourself and your baby. A change in  hormones during pregnancy causes your breasts to make breast milk in your milk-producing glands. Hormones prevent breast milk from being released before your baby is born. They also prompt milk flow after birth. Once breastfeeding has begun, thoughts of your baby, as well as his or her sucking or crying, can stimulate the release of milk from your milk-producing glands. Benefits of breastfeeding Research shows that breastfeeding offers many health benefits for infants and mothers. It also offers a cost-free and convenient way to feed your baby. For your baby  Your first milk (colostrum) helps your baby's digestive system to function better.  Special cells in your milk (antibodies) help your baby to fight off infections.  Breastfed babies are less likely to develop asthma, allergies, obesity, or type 2 diabetes. They are also at lower risk for sudden infant death syndrome (SIDS).  Nutrients in breast milk are better able to meet your baby's needs compared to infant formula.  Breast milk improves your baby's brain development. For you  Breastfeeding helps to create a very special bond between you and your baby.  Breastfeeding is convenient. Breast milk costs nothing and is always available at the correct temperature.  Breastfeeding helps to burn calories. It helps you to lose the weight that you gained during pregnancy.  Breastfeeding makes your uterus return faster to its size before pregnancy. It also slows bleeding (lochia) after you give birth.  Breastfeeding helps to lower your risk of developing type 2 diabetes, osteoporosis, rheumatoid arthritis, cardiovascular disease, and breast, ovarian, uterine, and endometrial cancer later in life. Breastfeeding basics Starting breastfeeding  Find a comfortable place to sit or lie down, with your neck and back well-supported.  Place a pillow or a rolled-up blanket under your baby to bring him or her to the level of your breast (if you are  seated). Nursing pillows are specially designed to help support your arms and your baby while you breastfeed.  Make sure that your baby's tummy (abdomen) is facing your abdomen.  Gently massage your breast. With your fingertips, massage from the outer edges of your breast inward   toward the nipple. This encourages milk flow. If your milk flows slowly, you may need to continue this action during the feeding.  Support your breast with 4 fingers underneath and your thumb above your nipple (make the letter "C" with your hand). Make sure your fingers are well away from your nipple and your baby's mouth.  Stroke your baby's lips gently with your finger or nipple.  When your baby's mouth is open wide enough, quickly bring your baby to your breast, placing your entire nipple and as much of the areola as possible into your baby's mouth. The areola is the colored area around your nipple. ? More areola should be visible above your baby's upper lip than below the lower lip. ? Your baby's lips should be opened and extended outward (flanged) to ensure an adequate, comfortable latch. ? Your baby's tongue should be between his or her lower gum and your breast.  Make sure that your baby's mouth is correctly positioned around your nipple (latched). Your baby's lips should create a seal on your breast and be turned out (everted).  It is common for your baby to suck about 2-3 minutes in order to start the flow of breast milk. Latching Teaching your baby how to latch onto your breast properly is very important. An improper latch can cause nipple pain, decreased milk supply, and poor weight gain in your baby. Also, if your baby is not latched onto your nipple properly, he or she may swallow some air during feeding. This can make your baby fussy. Burping your baby when you switch breasts during the feeding can help to get rid of the air. However, teaching your baby to latch on properly is still the best way to prevent  fussiness from swallowing air while breastfeeding. Signs that your baby has successfully latched onto your nipple  Silent tugging or silent sucking, without causing you pain. Infant's lips should be extended outward (flanged).  Swallowing heard between every 3-4 sucks once your milk has started to flow (after your let-down milk reflex occurs).  Muscle movement above and in front of his or her ears while sucking.  Signs that your baby has not successfully latched onto your nipple  Sucking sounds or smacking sounds from your baby while breastfeeding.  Nipple pain.  If you think your baby has not latched on correctly, slip your finger into the corner of your baby's mouth to break the suction and place it between your baby's gums. Attempt to start breastfeeding again. Signs of successful breastfeeding Signs from your baby  Your baby will gradually decrease the number of sucks or will completely stop sucking.  Your baby will fall asleep.  Your baby's body will relax.  Your baby will retain a small amount of milk in his or her mouth.  Your baby will let go of your breast by himself or herself.  Signs from you  Breasts that have increased in firmness, weight, and size 1-3 hours after feeding.  Breasts that are softer immediately after breastfeeding.  Increased milk volume, as well as a change in milk consistency and color by the fifth day of breastfeeding.  Nipples that are not sore, cracked, or bleeding.  Signs that your baby is getting enough milk  Wetting at least 1-2 diapers during the first 24 hours after birth.  Wetting at least 5-6 diapers every 24 hours for the first week after birth. The urine should be clear or pale yellow by the age of 5 days.  Wetting 6-8   diapers every 24 hours as your baby continues to grow and develop.  At least 3 stools in a 24-hour period by the age of 5 days. The stool should be soft and yellow.  At least 3 stools in a 24-hour period by the  age of 7 days. The stool should be seedy and yellow.  No loss of weight greater than 10% of birth weight during the first 3 days of life.  Average weight gain of 4-7 oz (113-198 g) per week after the age of 4 days.  Consistent daily weight gain by the age of 5 days, without weight loss after the age of 2 weeks. After a feeding, your baby may spit up a small amount of milk. This is normal. Breastfeeding frequency and duration Frequent feeding will help you make more milk and can prevent sore nipples and extremely full breasts (breast engorgement). Breastfeed when you feel the need to reduce the fullness of your breasts or when your baby shows signs of hunger. This is called "breastfeeding on demand." Signs that your baby is hungry include:  Increased alertness, activity, or restlessness.  Movement of the head from side to side.  Opening of the mouth when the corner of the mouth or cheek is stroked (rooting).  Increased sucking sounds, smacking lips, cooing, sighing, or squeaking.  Hand-to-mouth movements and sucking on fingers or hands.  Fussing or crying.  Avoid introducing a pacifier to your baby in the first 4-6 weeks after your baby is born. After this time, you may choose to use a pacifier. Research has shown that pacifier use during the first year of a baby's life decreases the risk of sudden infant death syndrome (SIDS). Allow your baby to feed on each breast as long as he or she wants. When your baby unlatches or falls asleep while feeding from the first breast, offer the second breast. Because newborns are often sleepy in the first few weeks of life, you may need to awaken your baby to get him or her to feed. Breastfeeding times will vary from baby to baby. However, the following rules can serve as a guide to help you make sure that your baby is properly fed:  Newborns (babies 4 weeks of age or younger) may breastfeed every 1-3 hours.  Newborns should not go without breastfeeding  for longer than 3 hours during the day or 5 hours during the night.  You should breastfeed your baby a minimum of 8 times in a 24-hour period.  Breast milk pumping Pumping and storing breast milk allows you to make sure that your baby is exclusively fed your breast milk, even at times when you are unable to breastfeed. This is especially important if you go back to work while you are still breastfeeding, or if you are not able to be present during feedings. Your lactation consultant can help you find a method of pumping that works best for you and give you guidelines about how long it is safe to store breast milk. Caring for your breasts while you breastfeed Nipples can become dry, cracked, and sore while breastfeeding. The following recommendations can help keep your breasts moisturized and healthy:  Avoid using soap on your nipples.  Wear a supportive bra designed especially for nursing. Avoid wearing underwire-style bras or extremely tight bras (sports bras).  Air-dry your nipples for 3-4 minutes after each feeding.  Use only cotton bra pads to absorb leaked breast milk. Leaking of breast milk between feedings is normal.  Use lanolin   on your nipples after breastfeeding. Lanolin helps to maintain your skin's normal moisture barrier. Pure lanolin is not harmful (not toxic) to your baby. You may also hand express a few drops of breast milk and gently massage that milk into your nipples and allow the milk to air-dry.  In the first few weeks after giving birth, some women experience breast engorgement. Engorgement can make your breasts feel heavy, warm, and tender to the touch. Engorgement peaks within 3-5 days after you give birth. The following recommendations can help to ease engorgement:  Completely empty your breasts while breastfeeding or pumping. You may want to start by applying warm, moist heat (in the shower or with warm, water-soaked hand towels) just before feeding or pumping. This  increases circulation and helps the milk flow. If your baby does not completely empty your breasts while breastfeeding, pump any extra milk after he or she is finished.  Apply ice packs to your breasts immediately after breastfeeding or pumping, unless this is too uncomfortable for you. To do this: ? Put ice in a plastic bag. ? Place a towel between your skin and the bag. ? Leave the ice on for 20 minutes, 2-3 times a day.  Make sure that your baby is latched on and positioned properly while breastfeeding.  If engorgement persists after 48 hours of following these recommendations, contact your health care provider or a lactation consultant. Overall health care recommendations while breastfeeding  Eat 3 healthy meals and 3 snacks every day. Well-nourished mothers who are breastfeeding need an additional 450-500 calories a day. You can meet this requirement by increasing the amount of a balanced diet that you eat.  Drink enough water to keep your urine pale yellow or clear.  Rest often, relax, and continue to take your prenatal vitamins to prevent fatigue, stress, and low vitamin and mineral levels in your body (nutrient deficiencies).  Do not use any products that contain nicotine or tobacco, such as cigarettes and e-cigarettes. Your baby may be harmed by chemicals from cigarettes that pass into breast milk and exposure to secondhand smoke. If you need help quitting, ask your health care provider.  Avoid alcohol.  Do not use illegal drugs or marijuana.  Talk with your health care provider before taking any medicines. These include over-the-counter and prescription medicines as well as vitamins and herbal supplements. Some medicines that may be harmful to your baby can pass through breast milk.  It is possible to become pregnant while breastfeeding. If birth control is desired, ask your health care provider about options that will be safe while breastfeeding your baby. Where to find more  information: [employer]: www.llli.org Contact a health care provider if:  You feel like you want to stop breastfeeding or have become frustrated with breastfeeding.  Your nipples are cracked or bleeding.  Your breasts are red, tender, or warm.  You have: ? Painful breasts or nipples. ? A swollen area on either breast. ? A fever or chills. ? Nausea or vomiting. ? Drainage other than breast milk from your nipples.  Your breasts do not become full before feedings by the fifth day after you give birth.  You feel sad and depressed.  Your baby is: ? Too sleepy to eat well. ? Having trouble sleeping. ? More than 1 week old and wetting fewer than 6 diapers in a 24-hour period. ? Not gaining weight by 5 days of age.  Your baby has fewer than 3 stools in a 24-hour period.    Your baby's skin or the white parts of his or her eyes become yellow. Get help right away if:  Your baby is overly tired (lethargic) and does not want to wake up and feed.  Your baby develops an unexplained fever. Summary  Breastfeeding offers many health benefits for infant and mothers.  Try to breastfeed your infant when he or she shows early signs of hunger.  Gently tickle or stroke your baby's lips with your finger or nipple to allow the baby to open his or her mouth. Bring the baby to your breast. Make sure that much of the areola is in your baby's mouth. Offer one side and burp the baby before you offer the other side.  Talk with your health care provider or lactation consultant if you have questions or you face problems as you breastfeed. This information is not intended to replace advice given to you by your health care provider. Make sure you discuss any questions you have with your health care provider. Document Released: 06/01/2005 Document Revised: 07/03/2016 Document Reviewed: 07/03/2016 Elsevier Interactive Patient Education  2018 Elsevier Inc.  

## 2018-04-30 LAB — CBC
Hematocrit: 31.2 % — ABNORMAL LOW (ref 34.0–46.6)
Hemoglobin: 10.4 g/dL — ABNORMAL LOW (ref 11.1–15.9)
MCH: 27.5 pg (ref 26.6–33.0)
MCHC: 33.3 g/dL (ref 31.5–35.7)
MCV: 83 fL (ref 79–97)
Platelets: 228 x10E3/uL (ref 150–450)
RBC: 3.78 x10E6/uL (ref 3.77–5.28)
RDW: 13.7 % (ref 12.3–15.4)
WBC: 5.2 x10E3/uL (ref 3.4–10.8)

## 2018-04-30 LAB — COMPREHENSIVE METABOLIC PANEL
ALBUMIN: 4.1 g/dL (ref 3.5–5.5)
ALK PHOS: 37 IU/L — AB (ref 39–117)
ALT: 14 IU/L (ref 0–32)
AST: 12 IU/L (ref 0–40)
Albumin/Globulin Ratio: 1.6 (ref 1.2–2.2)
BILIRUBIN TOTAL: 0.4 mg/dL (ref 0.0–1.2)
BUN / CREAT RATIO: 9 (ref 9–23)
BUN: 6 mg/dL (ref 6–20)
CHLORIDE: 105 mmol/L (ref 96–106)
CO2: 21 mmol/L (ref 20–29)
CREATININE: 0.7 mg/dL (ref 0.57–1.00)
Calcium: 9.6 mg/dL (ref 8.7–10.2)
GFR calc Af Amer: 141 mL/min/{1.73_m2} (ref >59)
GFR calc non Af Amer: 123 mL/min/{1.73_m2} (ref >59)
GLUCOSE: 67 mg/dL (ref 65–99)
Globulin, Total: 2.5 g/dL (ref 1.5–4.5)
Potassium: 4.3 mmol/L (ref 3.5–5.2)
Sodium: 138 mmol/L (ref 134–144)
Total Protein: 6.6 g/dL (ref 6.0–8.5)

## 2018-04-30 LAB — PROTEIN / CREATININE RATIO, URINE
Creatinine, Urine: 78.3 mg/dL
Protein, Ur: 4.9 mg/dL
Protein/Creat Ratio: 63 mg/g{creat} (ref 0–200)

## 2018-05-03 LAB — AFP, SERUM, OPEN SPINA BIFIDA
AFP MoM: 1.3
AFP VALUE AFPOSL: 52.5 ng/mL
Gest. Age on Collection Date: 16.2 weeks
MATERNAL AGE AT EDD: 24.1 a
OSBR Risk 1 IN: 9606
Test Results:: NEGATIVE
Weight: 134 [lb_av]

## 2018-05-11 ENCOUNTER — Encounter (HOSPITAL_COMMUNITY): Payer: Self-pay

## 2018-05-11 ENCOUNTER — Encounter: Payer: Medicaid Other | Admitting: Advanced Practice Midwife

## 2018-05-20 ENCOUNTER — Ambulatory Visit (HOSPITAL_COMMUNITY)
Admission: RE | Admit: 2018-05-20 | Discharge: 2018-05-20 | Disposition: A | Discharge status: Home / Self Care | Payer: Medicaid Other | Source: Ambulatory Visit | Attending: Obstetrics and Gynecology | Admitting: Obstetrics and Gynecology

## 2018-05-20 DIAGNOSIS — Z3A19 19 weeks gestation of pregnancy: Secondary | ICD-10-CM | POA: Diagnosis not present

## 2018-05-20 DIAGNOSIS — Z3480 Encounter for supervision of other normal pregnancy, unspecified trimester: Secondary | ICD-10-CM

## 2018-05-20 DIAGNOSIS — Z363 Encounter for antenatal screening for malformations: Secondary | ICD-10-CM | POA: Diagnosis not present

## 2018-05-23 ENCOUNTER — Other Ambulatory Visit (HOSPITAL_COMMUNITY): Payer: Self-pay | Admitting: *Deleted

## 2018-05-23 DIAGNOSIS — Z362 Encounter for other antenatal screening follow-up: Secondary | ICD-10-CM

## 2018-05-27 ENCOUNTER — Encounter: Payer: Medicaid Other | Admitting: Obstetrics

## 2018-06-09 ENCOUNTER — Encounter: Payer: Self-pay | Admitting: Obstetrics

## 2018-06-15 NOTE — L&D Delivery Note (Signed)
Patient is a 23 y.o. now G3P3 s/p NSVD at [redacted]w[redacted]d, who was admitted for SOL.  She progressed without augmentation to complete and pushed 9 minutes to deliver. Head delivered spontaneously in LOA position, shoulder obstructed at pelvic bone. <30 second shoulder dystocia resolved with McRoberts and suprapubic pressure. Spontaneous cry at delivery. Cord clamping delayed by several minutes then clamped by CNM and cut by FOB.  Placenta intact and spontaneous, bleeding minimal. Mom and baby stable prior to transfer to postpartum. She plans on breastfeeding. She requests BTL for birth control but will start on NuvaRing until BTL able to be scheduled.   Delivery Note At 10:34 PM a viable and healthy female was delivered via Vaginal, Spontaneous (Presentation: LOA ).  APGAR: 8, 9; weight pending  .   Placenta intact and spontaneous, bleeding minimal. 3V Cord:  with the following complications: <30 second shoulder dystocia.  Anesthesia:  Epidural Episiotomy: None Lacerations: None Suture Repair: None Est. Blood Loss (mL): 63  Mom to postpartum.  Baby to Couplet care / Skin to Skin.  Sharyon Cable CNM 10/07/2018, 11:07 PM

## 2018-06-23 ENCOUNTER — Encounter: Payer: Self-pay | Admitting: Obstetrics

## 2018-06-23 ENCOUNTER — Other Ambulatory Visit (HOSPITAL_COMMUNITY)
Admission: RE | Admit: 2018-06-23 | Discharge: 2018-06-23 | Disposition: A | Discharge status: Home / Self Care | Payer: Medicaid Other | Source: Ambulatory Visit | Attending: Obstetrics | Admitting: Obstetrics

## 2018-06-23 ENCOUNTER — Ambulatory Visit (INDEPENDENT_AMBULATORY_CARE_PROVIDER_SITE_OTHER): Payer: Medicaid Other | Admitting: Obstetrics

## 2018-06-23 VITALS — BP 119/77 | HR 85 | Wt 136.4 lb

## 2018-06-23 DIAGNOSIS — O09299 Supervision of pregnancy with other poor reproductive or obstetric history, unspecified trimester: Secondary | ICD-10-CM

## 2018-06-23 DIAGNOSIS — O09899 Supervision of other high risk pregnancies, unspecified trimester: Secondary | ICD-10-CM

## 2018-06-23 DIAGNOSIS — O09892 Supervision of other high risk pregnancies, second trimester: Secondary | ICD-10-CM

## 2018-06-23 DIAGNOSIS — N898 Other specified noninflammatory disorders of vagina: Secondary | ICD-10-CM | POA: Diagnosis present

## 2018-06-23 DIAGNOSIS — O09292 Supervision of pregnancy with other poor reproductive or obstetric history, second trimester: Secondary | ICD-10-CM

## 2018-06-23 DIAGNOSIS — B373 Candidiasis of vulva and vagina: Secondary | ICD-10-CM

## 2018-06-23 DIAGNOSIS — B3731 Acute candidiasis of vulva and vagina: Secondary | ICD-10-CM

## 2018-06-23 DIAGNOSIS — Z3480 Encounter for supervision of other normal pregnancy, unspecified trimester: Secondary | ICD-10-CM

## 2018-06-23 DIAGNOSIS — Z3482 Encounter for supervision of other normal pregnancy, second trimester: Secondary | ICD-10-CM

## 2018-06-23 DIAGNOSIS — Z3A24 24 weeks gestation of pregnancy: Secondary | ICD-10-CM

## 2018-06-23 MED ORDER — ASPIRIN 81 MG PO CHEW: 81.0000 mg | CHEWABLE_TABLET | Freq: Every day | ORAL | 11 refills | Status: DC

## 2018-06-23 MED ORDER — TERCONAZOLE 0.4 % VA CREA: 1.0000 | TOPICAL_CREAM | Freq: Every day | VAGINAL | 0 refills | Status: DC

## 2018-06-23 NOTE — Progress Notes (Signed)
Pt is here for ROB. [redacted]w[redacted]d.

## 2018-06-23 NOTE — Progress Notes (Signed)
Subjective:  Kerry Jimenez is a 23 y.o. G3P2002 at [redacted]w[redacted]d being seen today for ongoing prenatal care.  She is currently monitored for the following issues for this low-risk pregnancy and has Supervision of other normal pregnancy, antepartum; Hx of preeclampsia, prior pregnancy, currently pregnant; and Short interval between pregnancies affecting pregnancy, antepartum on their problem list.  Patient reports vaginal irritation.  Contractions: Irritability. Vag. Bleeding: None.  Movement: Present. Denies leaking of fluid.   The following portions of the patient's history were reviewed and updated as appropriate: allergies, current medications, past family history, past medical history, past social history, past surgical history and problem list. Problem list updated.  Objective:   Vitals:   06/23/18 1138  BP: 119/77  Pulse: 85  Weight: 136 lb 6.4 oz (61.9 kg)    Fetal Status:     Movement: Present     General:  Alert, oriented and cooperative. Patient is in no acute distress.  Skin: Skin is warm and dry. No rash noted.   Cardiovascular: Normal heart rate noted  Respiratory: Normal respiratory effort, no problems with respiration noted  Abdomen: Soft, gravid, appropriate for gestational age. Pain/Pressure: Present     Pelvic:  Cervical exam deferred        Extremities: Normal range of motion.  Edema: Trace  Mental Status: Normal mood and affect. Normal behavior. Normal judgment and thought content.   Urinalysis:      Assessment and Plan:  Pregnancy: G3P2002 at [redacted]w[redacted]d  1. Supervision of other normal pregnancy, antepartum  2. Hx of preeclampsia, prior pregnancy, currently pregnant - Baby ASA Rx  3. Short interval between pregnancies affecting pregnancy, antepartum  4. Vaginal discharge Rx: - Cervicovaginal ancillary only  5. Candida vaginitis, chronic Rx: - terconazole (TERAZOL 7) 0.4 % vaginal cream; Place 1 applicator vaginally at bedtime.  Dispense: 45 g; Refill: 0 -  Hemoglobin A1c; Future.  R/O Diabetes.  Preterm labor symptoms and general obstetric precautions including but not limited to vaginal bleeding, contractions, leaking of fluid and fetal movement were reviewed in detail with the patient. Please refer to After Visit Summary for other counseling recommendations.  Return in about 4 weeks (around 07/21/2018) for ROB, 2 hour OGTT.   Brock Bad, MD

## 2018-06-24 ENCOUNTER — Ambulatory Visit (HOSPITAL_COMMUNITY)
Admission: RE | Admit: 2018-06-24 | Discharge: 2018-06-24 | Disposition: A | Discharge status: Home / Self Care | Payer: Medicaid Other | Source: Ambulatory Visit | Attending: Obstetrics and Gynecology | Admitting: Obstetrics and Gynecology

## 2018-06-24 ENCOUNTER — Telehealth: Payer: Self-pay

## 2018-06-24 DIAGNOSIS — Z362 Encounter for other antenatal screening follow-up: Secondary | ICD-10-CM | POA: Diagnosis present

## 2018-06-24 DIAGNOSIS — Z3A24 24 weeks gestation of pregnancy: Secondary | ICD-10-CM

## 2018-06-24 LAB — CERVICOVAGINAL ANCILLARY ONLY
Bacterial vaginitis: NEGATIVE
Candida vaginitis: POSITIVE — AB
Chlamydia: NEGATIVE
NEISSERIA GONORRHEA: NEGATIVE
Trichomonas: NEGATIVE

## 2018-06-24 NOTE — Telephone Encounter (Signed)
Pt called stating that she has been blood and mucus in her stool for about a month. Pt states that she does not have hemorrhoids that she can tell. Pt advised to go to either her primary care or to [facility] to be evaluated.

## 2018-06-25 ENCOUNTER — Other Ambulatory Visit: Payer: Self-pay | Admitting: Obstetrics

## 2018-07-21 ENCOUNTER — Other Ambulatory Visit: Payer: Medicaid Other

## 2018-07-21 ENCOUNTER — Encounter: Payer: Medicaid Other | Admitting: Obstetrics

## 2018-07-22 ENCOUNTER — Other Ambulatory Visit: Payer: Medicaid Other

## 2018-07-22 ENCOUNTER — Ambulatory Visit (INDEPENDENT_AMBULATORY_CARE_PROVIDER_SITE_OTHER): Payer: Medicaid Other | Admitting: Obstetrics

## 2018-07-22 ENCOUNTER — Encounter: Payer: Self-pay | Admitting: Obstetrics

## 2018-07-22 VITALS — BP 118/72 | HR 98 | Wt 139.8 lb

## 2018-07-22 DIAGNOSIS — O09299 Supervision of pregnancy with other poor reproductive or obstetric history, unspecified trimester: Secondary | ICD-10-CM

## 2018-07-22 DIAGNOSIS — Z3483 Encounter for supervision of other normal pregnancy, third trimester: Secondary | ICD-10-CM

## 2018-07-22 DIAGNOSIS — O09899 Supervision of other high risk pregnancies, unspecified trimester: Secondary | ICD-10-CM

## 2018-07-22 DIAGNOSIS — Z3480 Encounter for supervision of other normal pregnancy, unspecified trimester: Secondary | ICD-10-CM

## 2018-07-22 DIAGNOSIS — O09893 Supervision of other high risk pregnancies, third trimester: Secondary | ICD-10-CM

## 2018-07-22 DIAGNOSIS — O09293 Supervision of pregnancy with other poor reproductive or obstetric history, third trimester: Secondary | ICD-10-CM

## 2018-07-22 NOTE — Progress Notes (Signed)
Pt presents for ROB and 2 gtt labs. Pt requests Terazol cream.

## 2018-07-22 NOTE — Progress Notes (Signed)
Subjective:  Kerry Jimenez is a 23 y.o. G3P2002 at 5920w2d being seen today for ongoing prenatal care.  She is currently monitored for the following issues for this high-risk pregnancy and has Supervision of other normal pregnancy, antepartum; Hx of preeclampsia, prior pregnancy, currently pregnant; and Short interval between pregnancies affecting pregnancy, antepartum on their problem list.  Patient reports no complaints.  Contractions: Irregular. Vag. Bleeding: None.  Movement: Present. Denies leaking of fluid.   The following portions of the patient's history were reviewed and updated as appropriate: allergies, current medications, past family history, past medical history, past social history, past surgical history and problem list. Problem list updated.  Objective:   Vitals:   07/22/18 0839  BP: 118/72  Pulse: 98  Weight: 139 lb 12.8 oz (63.4 kg)    Fetal Status:     Movement: Present     General:  Alert, oriented and cooperative. Patient is in no acute distress.  Skin: Skin is warm and dry. No rash noted.   Cardiovascular: Normal heart rate noted  Respiratory: Normal respiratory effort, no problems with respiration noted  Abdomen: Soft, gravid, appropriate for gestational age. Pain/Pressure: Absent     Pelvic:  Cervical exam deferred        Extremities: Normal range of motion.  Edema: Trace  Mental Status: Normal mood and affect. Normal behavior. Normal judgment and thought content.   Urinalysis:      Assessment and Plan:  Pregnancy: G3P2002 at 3920w2d  1. Supervision of other normal pregnancy, antepartum Rx: - Glucose Tolerance, 2 Hours w/1 Hour - HIV Antibody (routine testing w rflx) - RPR - CBC  2. Hx of preeclampsia, prior pregnancy, currently pregnant - daily Baby ASA  3. Short interval between pregnancies affecting pregnancy, antepartum   Preterm labor symptoms and general obstetric precautions including but not limited to vaginal bleeding, contractions, leaking  of fluid and fetal movement were reviewed in detail with the patient. Please refer to After Visit Summary for other counseling recommendations.  Return in about 2 weeks (around 08/05/2018) for ROB.   Brock BadHarper, Jasir Rother A, MD

## 2018-07-23 ENCOUNTER — Other Ambulatory Visit: Payer: Self-pay | Admitting: Obstetrics

## 2018-07-23 DIAGNOSIS — D508 Other iron deficiency anemias: Secondary | ICD-10-CM

## 2018-07-23 LAB — GLUCOSE TOLERANCE, 2 HOURS W/ 1HR
Glucose, 1 hour: 124 mg/dL (ref 65–179)
Glucose, 2 hour: 94 mg/dL (ref 65–152)
Glucose, Fasting: 78 mg/dL (ref 65–91)

## 2018-07-23 LAB — RPR: RPR Ser Ql: NONREACTIVE

## 2018-07-23 LAB — CBC
HEMATOCRIT: 32.7 % — AB (ref 34.0–46.6)
Hemoglobin: 10.3 g/dL — ABNORMAL LOW (ref 11.1–15.9)
MCH: 27.1 pg (ref 26.6–33.0)
MCHC: 31.5 g/dL (ref 31.5–35.7)
MCV: 86 fL (ref 79–97)
Platelets: 232 10*3/uL (ref 150–450)
RBC: 3.8 x10E6/uL (ref 3.77–5.28)
RDW: 13.2 % (ref 11.7–15.4)
WBC: 6.5 10*3/uL (ref 3.4–10.8)

## 2018-07-23 LAB — HIV ANTIBODY (ROUTINE TESTING W REFLEX): HIV Screen 4th Generation wRfx: NONREACTIVE

## 2018-07-23 MED ORDER — FERROUS SULFATE 325 (65 FE) MG PO TABS: 325.0000 mg | ORAL_TABLET | Freq: Two times a day (BID) | ORAL | 5 refills | Status: DC

## 2018-08-05 ENCOUNTER — Encounter: Payer: Medicaid Other | Admitting: Medical

## 2018-08-19 ENCOUNTER — Ambulatory Visit (INDEPENDENT_AMBULATORY_CARE_PROVIDER_SITE_OTHER): Payer: Medicaid Other | Admitting: Medical

## 2018-08-19 ENCOUNTER — Encounter: Payer: Self-pay | Admitting: Medical

## 2018-08-19 VITALS — BP 104/68 | HR 83 | Wt 139.6 lb

## 2018-08-19 DIAGNOSIS — Z3480 Encounter for supervision of other normal pregnancy, unspecified trimester: Secondary | ICD-10-CM

## 2018-08-19 DIAGNOSIS — Z3A32 32 weeks gestation of pregnancy: Secondary | ICD-10-CM

## 2018-08-19 DIAGNOSIS — O09299 Supervision of pregnancy with other poor reproductive or obstetric history, unspecified trimester: Secondary | ICD-10-CM

## 2018-08-19 DIAGNOSIS — O09293 Supervision of pregnancy with other poor reproductive or obstetric history, third trimester: Secondary | ICD-10-CM

## 2018-08-19 NOTE — Progress Notes (Signed)
   PRENATAL VISIT NOTE  Subjective:  Kerry Jimenez is a 23 y.o. G3P2002 at [redacted]w[redacted]d being seen today for ongoing prenatal care.  She is currently monitored for the following issues for this low-risk pregnancy and has Supervision of other normal pregnancy, antepartum; Hx of preeclampsia, prior pregnancy, currently pregnant; and Short interval between pregnancies affecting pregnancy, antepartum on their problem list.  Patient reports no complaints.  Contractions: Irregular. Vag. Bleeding: None.  Movement: Present. Denies leaking of fluid.   The following portions of the patient's history were reviewed and updated as appropriate: allergies, current medications, past family history, past medical history, past social history, past surgical history and problem list. Problem list updated.  Objective:   Vitals:   08/19/18 1125  BP: 104/68  Pulse: 83  Weight: 139 lb 9.6 oz (63.3 kg)    Fetal Status: Fetal Heart Rate (bpm): 155 Fundal Height: 32 cm Movement: Present     General:  Alert, oriented and cooperative. Patient is in no acute distress.  Skin: Skin is warm and dry. No rash noted.   Cardiovascular: Normal heart rate noted  Respiratory: Normal respiratory effort, no problems with respiration noted  Abdomen: Soft, gravid, appropriate for gestational age.  Pain/Pressure: Present     Pelvic: Cervical exam deferred        Extremities: Normal range of motion.  Edema: Trace  Mental Status: Normal mood and affect. Normal behavior. Normal judgment and thought content.   Assessment and Plan:  Pregnancy: G3P2002 at [redacted]w[redacted]d  1. Supervision of other normal pregnancy, antepartum  2. Hx of preeclampsia, prior pregnancy, currently pregnant - normotensive today   Preterm labor symptoms and general obstetric precautions including but not limited to vaginal bleeding, contractions, leaking of fluid and fetal movement were reviewed in detail with the patient. Please refer to After Visit Summary for other  counseling recommendations.  Return in about 2 weeks (around 09/02/2018) for LOB.  Future Appointments  Date Time Provider Department Center  09/02/2018 11:00 AM Marny Lowenstein, PA-C [facility] None    Vonzella Nipple, [place]

## 2018-08-19 NOTE — Patient Instructions (Signed)
Fetal Movement Counts Patient Name: ________________________________________________ Patient Due Date: ____________________ What is a fetal movement count?  A fetal movement count is the number of times that you feel your baby move during a certain amount of time. This may also be called a fetal kick count. A fetal movement count is recommended for every pregnant woman. You may be asked to start counting fetal movements as early as week 28 of your pregnancy. Pay attention to when your baby is most active. You may notice your baby's sleep and wake cycles. You may also notice things that make your baby move more. You should do a fetal movement count:  When your baby is normally most active.  At the same time each day. A good time to count movements is while you are resting, after having something to eat and drink. How do I count fetal movements? 1. Find a quiet, comfortable area. Sit, or lie down on your side. 2. Write down the date, the start time and stop time, and the number of movements that you felt between those two times. Take this information with you to your health care visits. 3. For 2 hours, count kicks, flutters, swishes, rolls, and jabs. You should feel at least 10 movements during 2 hours. 4. You may stop counting after you have felt 10 movements. 5. If you do not feel 10 movements in 2 hours, have something to eat and drink. Then, keep resting and counting for 1 hour. If you feel at least 4 movements during that hour, you may stop counting. Contact a health care provider if:  You feel fewer than 4 movements in 2 hours.  Your baby is not moving like he or she usually does. Date: ____________ Start time: ____________ Stop time: ____________ Movements: ____________ Date: ____________ Start time: ____________ Stop time: ____________ Movements: ____________ Date: ____________ Start time: ____________ Stop time: ____________ Movements: ____________ Date: ____________ Start time:  ____________ Stop time: ____________ Movements: ____________ Date: ____________ Start time: ____________ Stop time: ____________ Movements: ____________ Date: ____________ Start time: ____________ Stop time: ____________ Movements: ____________ Date: ____________ Start time: ____________ Stop time: ____________ Movements: ____________ Date: ____________ Start time: ____________ Stop time: ____________ Movements: ____________ Date: ____________ Start time: ____________ Stop time: ____________ Movements: ____________ This information is not intended to replace advice given to you by your health care provider. Make sure you discuss any questions you have with your health care provider. Document Released: 07/01/2006 Document Revised: 01/29/2016 Document Reviewed: 07/11/2015 Elsevier Interactive Patient Education  2019 Elsevier Inc. Braxton Hicks Contractions Contractions of the uterus can occur throughout pregnancy, but they are not always a sign that you are in labor. You may have practice contractions called Braxton Hicks contractions. These false labor contractions are sometimes confused with true labor. What are Braxton Hicks contractions? Braxton Hicks contractions are tightening movements that occur in the muscles of the uterus before labor. Unlike true labor contractions, these contractions do not result in opening (dilation) and thinning of the cervix. Toward the end of pregnancy (32-34 weeks), Braxton Hicks contractions can happen more often and may become stronger. These contractions are sometimes difficult to tell apart from true labor because they can be very uncomfortable. You should not feel embarrassed if you go to the hospital with false labor. Sometimes, the only way to tell if you are in true labor is for your health care provider to look for changes in the cervix. The health care provider will do a physical exam and may monitor your contractions. If   you are not in true labor, the exam  should show that your cervix is not dilating and your water has not broken. If there are no other health problems associated with your pregnancy, it is completely safe for you to be sent home with false labor. You may continue to have Braxton Hicks contractions until you go into true labor. How to tell the difference between true labor and false labor True labor  Contractions last 30-70 seconds.  Contractions become very regular.  Discomfort is usually felt in the top of the uterus, and it spreads to the lower abdomen and low back.  Contractions do not go away with walking.  Contractions usually become more intense and increase in frequency.  The cervix dilates and gets thinner. False labor  Contractions are usually shorter and not as strong as true labor contractions.  Contractions are usually irregular.  Contractions are often felt in the front of the lower abdomen and in the groin.  Contractions may go away when you walk around or change positions while lying down.  Contractions get weaker and are shorter-lasting as time goes on.  The cervix usually does not dilate or become thin. Follow these instructions at home:   Take over-the-counter and prescription medicines only as told by your health care provider.  Keep up with your usual exercises and follow other instructions from your health care provider.  Eat and drink lightly if you think you are going into labor.  If Braxton Hicks contractions are making you uncomfortable: ? Change your position from lying down or resting to walking, or change from walking to resting. ? Sit and rest in a tub of warm water. ? Drink enough fluid to keep your urine pale yellow. Dehydration may cause these contractions. ? Do slow and deep breathing several times an hour.  Keep all follow-up prenatal visits as told by your health care provider. This is important. Contact a health care provider if:  You have a fever.  You have continuous  pain in your abdomen. Get help right away if:  Your contractions become stronger, more regular, and closer together.  You have fluid leaking or gushing from your vagina.  You pass blood-tinged mucus (bloody show).  You have bleeding from your vagina.  You have low back pain that you never had before.  You feel your baby's head pushing down and causing pelvic pressure.  Your baby is not moving inside you as much as it used to. Summary  Contractions that occur before labor are called Braxton Hicks contractions, false labor, or practice contractions.  Braxton Hicks contractions are usually shorter, weaker, farther apart, and less regular than true labor contractions. True labor contractions usually become progressively stronger and regular, and they become more frequent.  Manage discomfort from Braxton Hicks contractions by changing position, resting in a warm bath, drinking plenty of water, or practicing deep breathing. This information is not intended to replace advice given to you by your health care provider. Make sure you discuss any questions you have with your health care provider. Document Released: 10/15/2016 Document Revised: 03/16/2017 Document Reviewed: 10/15/2016 Elsevier Interactive Patient Education  2019 Elsevier Inc.  

## 2018-08-19 NOTE — Progress Notes (Signed)
Pt is here for ROB. [redacted]w[redacted]d.  

## 2018-09-02 ENCOUNTER — Other Ambulatory Visit: Payer: Self-pay

## 2018-09-02 ENCOUNTER — Ambulatory Visit (INDEPENDENT_AMBULATORY_CARE_PROVIDER_SITE_OTHER): Payer: Medicaid Other | Admitting: Medical

## 2018-09-02 ENCOUNTER — Encounter: Payer: Self-pay | Admitting: Medical

## 2018-09-02 VITALS — BP 118/66 | HR 91 | Wt 140.0 lb

## 2018-09-02 DIAGNOSIS — O09299 Supervision of pregnancy with other poor reproductive or obstetric history, unspecified trimester: Secondary | ICD-10-CM

## 2018-09-02 DIAGNOSIS — B3731 Acute candidiasis of vulva and vagina: Secondary | ICD-10-CM

## 2018-09-02 DIAGNOSIS — Z3A34 34 weeks gestation of pregnancy: Secondary | ICD-10-CM

## 2018-09-02 DIAGNOSIS — B373 Candidiasis of vulva and vagina: Secondary | ICD-10-CM

## 2018-09-02 DIAGNOSIS — O98813 Other maternal infectious and parasitic diseases complicating pregnancy, third trimester: Secondary | ICD-10-CM

## 2018-09-02 DIAGNOSIS — Z3480 Encounter for supervision of other normal pregnancy, unspecified trimester: Secondary | ICD-10-CM

## 2018-09-02 MED ORDER — TERCONAZOLE 0.4 % VA CREA: 1.0000 | TOPICAL_CREAM | Freq: Every day | VAGINAL | 1 refills | Status: DC

## 2018-09-02 NOTE — Progress Notes (Signed)
   PRENATAL VISIT NOTE  Subjective:  Kerry Jimenez is a 23 y.o. G3P2002 at [redacted]w[redacted]d being seen today for ongoing prenatal care.  She is currently monitored for the following issues for this low-risk pregnancy and has Supervision of other normal pregnancy, antepartum; Hx of preeclampsia, prior pregnancy, currently pregnant; and Short interval between pregnancies affecting pregnancy, antepartum on their problem list.  Patient reports no complaints.  Contractions: Not present. Vag. Bleeding: None.  Movement: Present. Denies leaking of fluid.   The following portions of the patient's history were reviewed and updated as appropriate: allergies, current medications, past family history, past medical history, past social history, past surgical history and problem list.   Objective:   Vitals:   09/02/18 1053  BP: 118/66  Pulse: 91  Weight: 140 lb (63.5 kg)    Fetal Status: Fetal Heart Rate (bpm): 150 Fundal Height: 34 cm Movement: Present     General:  Alert, oriented and cooperative. Patient is in no acute distress.  Skin: Skin is warm and dry. No rash noted.   Cardiovascular: Normal heart rate noted  Respiratory: Normal respiratory effort, no problems with respiration noted  Abdomen: Soft, gravid, appropriate for gestational age.  Pain/Pressure: Present     Pelvic: Cervical exam deferred        Extremities: Normal range of motion.  Edema: None  Mental Status: Normal mood and affect. Normal behavior. Normal judgment and thought content.   Assessment and Plan:  Pregnancy: G3P2002 at [redacted]w[redacted]d 1. Supervision of other normal pregnancy, antepartum  2. Hx of preeclampsia, prior pregnancy, currently pregnant - Normotensive today  3. Candida vaginitis - Patient concerned about frequent yeast infections, currently asymptomatic but requesting a refill  - terconazole (TERAZOL 7) 0.4 % vaginal cream; Place 1 applicator vaginally at bedtime.  Dispense: 45 g; Refill: 1  Preterm labor symptoms and  general obstetric precautions including but not limited to vaginal bleeding, contractions, leaking of fluid and fetal movement were reviewed in detail with the patient. Please refer to After Visit Summary for other counseling recommendations.   Return in about 2 weeks (around 09/16/2018) for LOB.  No future appointments.  Vonzella Nipple, PA-C

## 2018-09-02 NOTE — Patient Instructions (Addendum)
Fetal Movement Counts Patient Name: ________________________________________________ Patient Due Date: ____________________ What is a fetal movement count?  A fetal movement count is the number of times that you feel your baby move during a certain amount of time. This may also be called a fetal kick count. A fetal movement count is recommended for every pregnant woman. You may be asked to start counting fetal movements as early as week 28 of your pregnancy. Pay attention to when your baby is most active. You may notice your baby's sleep and wake cycles. You may also notice things that make your baby move more. You should do a fetal movement count:  When your baby is normally most active.  At the same time each day. A good time to count movements is while you are resting, after having something to eat and drink. How do I count fetal movements? 1. Find a quiet, comfortable area. Sit, or lie down on your side. 2. Write down the date, the start time and stop time, and the number of movements that you felt between those two times. Take this information with you to your health care visits. 3. For 2 hours, count kicks, flutters, swishes, rolls, and jabs. You should feel at least 10 movements during 2 hours. 4. You may stop counting after you have felt 10 movements. 5. If you do not feel 10 movements in 2 hours, have something to eat and drink. Then, keep resting and counting for 1 hour. If you feel at least 4 movements during that hour, you may stop counting. Contact a health care provider if:  You feel fewer than 4 movements in 2 hours.  Your baby is not moving like he or she usually does. Date: ____________ Start time: ____________ Stop time: ____________ Movements: ____________ Date: ____________ Start time: ____________ Stop time: ____________ Movements: ____________ Date: ____________ Start time: ____________ Stop time: ____________ Movements: ____________ Date: ____________ Start time:  ____________ Stop time: ____________ Movements: ____________ Date: ____________ Start time: ____________ Stop time: ____________ Movements: ____________ Date: ____________ Start time: ____________ Stop time: ____________ Movements: ____________ Date: ____________ Start time: ____________ Stop time: ____________ Movements: ____________ Date: ____________ Start time: ____________ Stop time: ____________ Movements: ____________ Date: ____________ Start time: ____________ Stop time: ____________ Movements: ____________ This information is not intended to replace advice given to you by your health care provider. Make sure you discuss any questions you have with your health care provider. Document Released: 07/01/2006 Document Revised: 01/29/2016 Document Reviewed: 07/11/2015 Elsevier Interactive Patient Education  2019 [employer] Braxton Hicks Contractions Contractions of the uterus can occur throughout pregnancy, but they are not always a sign that you are in labor. You may have practice contractions called Braxton Hicks contractions. These false labor contractions are sometimes confused with true labor. What are Braxton Hicks contractions? Braxton Hicks contractions are tightening movements that occur in the muscles of the uterus before labor. Unlike true labor contractions, these contractions do not result in opening (dilation) and thinning of the cervix. Toward the end of pregnancy (32-34 weeks), Braxton Hicks contractions can happen more often and may become stronger. These contractions are sometimes difficult to tell apart from true labor because they can be very uncomfortable. You should not feel embarrassed if you go to the hospital with false labor. Sometimes, the only way to tell if you are in true labor is for your health care provider to look for changes in the cervix. The health care provider will do a physical exam and may monitor your contractions. If   you are not in true labor, the exam  should show that your cervix is not dilating and your water has not broken. If there are no other health problems associated with your pregnancy, it is completely safe for you to be sent home with false labor. You may continue to have Braxton Hicks contractions until you go into true labor. How to tell the difference between true labor and false labor True labor  Contractions last 30-70 seconds.  Contractions become very regular.  Discomfort is usually felt in the top of the uterus, and it spreads to the lower abdomen and low back.  Contractions do not go away with walking.  Contractions usually become more intense and increase in frequency.  The cervix dilates and gets thinner. False labor  Contractions are usually shorter and not as strong as true labor contractions.  Contractions are usually irregular.  Contractions are often felt in the front of the lower abdomen and in the groin.  Contractions may go away when you walk around or change positions while lying down.  Contractions get weaker and are shorter-lasting as time goes on.  The cervix usually does not dilate or become thin. Follow these instructions at home:   Take over-the-counter and prescription medicines only as told by your health care provider.  Keep up with your usual exercises and follow other instructions from your health care provider.  Eat and drink lightly if you think you are going into labor.  If Braxton Hicks contractions are making you uncomfortable: ? Change your position from lying down or resting to walking, or change from walking to resting. ? Sit and rest in a tub of warm water. ? Drink enough fluid to keep your urine pale yellow. Dehydration may cause these contractions. ? Do slow and deep breathing several times an hour.  Keep all follow-up prenatal visits as told by your health care provider. This is important. Contact a health care provider if:  You have a fever.  You have continuous  pain in your abdomen. Get help right away if:  Your contractions become stronger, more regular, and closer together.  You have fluid leaking or gushing from your vagina.  You pass blood-tinged mucus (bloody show).  You have bleeding from your vagina.  You have low back pain that you never had before.  You feel your baby's head pushing down and causing pelvic pressure.  Your baby is not moving inside you as much as it used to. Summary  Contractions that occur before labor are called Braxton Hicks contractions, false labor, or practice contractions.  Braxton Hicks contractions are usually shorter, weaker, farther apart, and less regular than true labor contractions. True labor contractions usually become progressively stronger and regular, and they become more frequent.  Manage discomfort from [facility] contractions by changing position, resting in a warm bath, drinking plenty of water, or practicing deep breathing. This information is not intended to replace advice given to you by your health care provider. Make sure you discuss any questions you have with your health care provider. Document Released: 10/15/2016 Document Revised: 03/16/2017 Document Reviewed: 10/15/2016 Elsevier Interactive Patient Education  2019 [employer].  Midwives at the [facility]  Melanie Bhambri Kim Booker Nixon Cresenzo-Dishmon Rolitta [place] Heather Annabell Howells Kooistra Glidden Randall An Leftwich Janene Madeira [place] Rochele Pages   Dr. Sheran Fava Dr. Marlis Edelson Dr. Aneta Mins

## 2018-09-16 ENCOUNTER — Encounter: Payer: Medicaid Other | Admitting: Medical

## 2018-09-20 IMAGING — US US OB < 14 WEEKS - US OB TV
1 series · 15 of 28 positions shown · non-contrast
Comparison: None.

CLINICAL DATA: Abdominal pain and vaginal bleeding during first
trimester. Quantitative beta HCG is pending. Estimated gestational
age by LMP is 6 weeks 0 days.

EXAM:
OBSTETRIC <14 WK US AND TRANSVAGINAL OB US
TECHNIQUE: Both transabdominal and transvaginal ultrasound examinations were
performed for complete evaluation of the gestation as well as the
maternal uterus, adnexal regions, and pelvic cul-de-sac.
Transvaginal technique was performed to assess early pregnancy.

[Series 1: us ob < 14 weeks - us ob tv · 79 acquisitions, 15 frames shown]
[im 1/79]
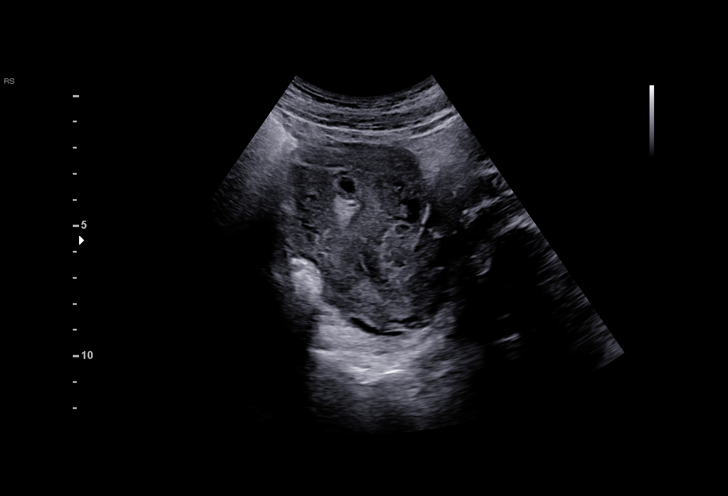
[im 6/79]
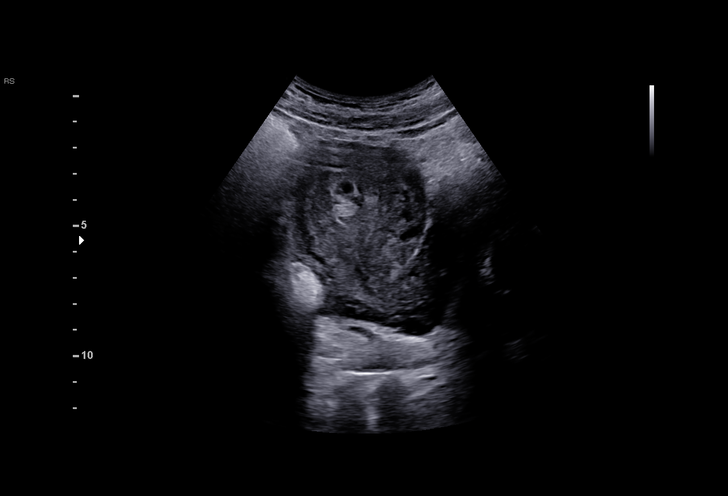
[im 12/79]
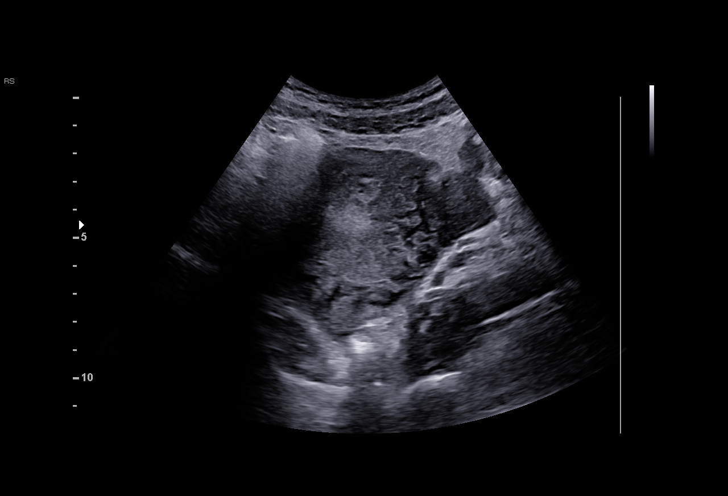
[im 18/79]
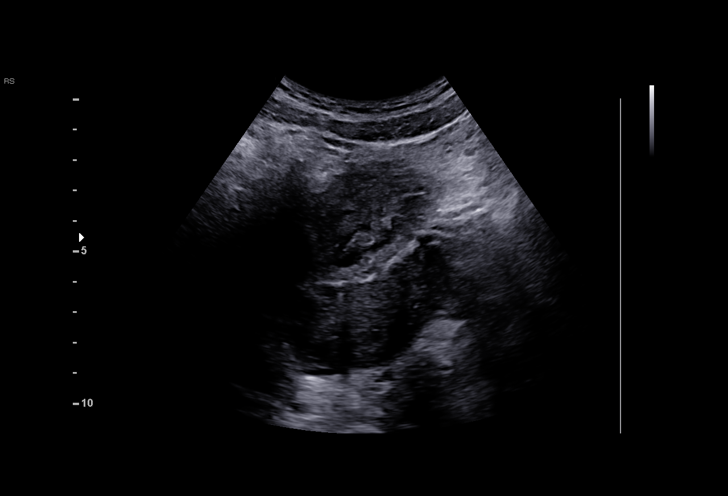
[im 24/79]
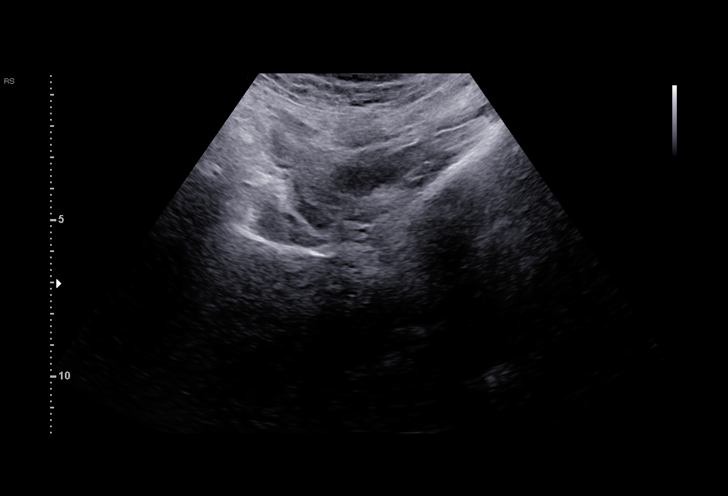
[im 29/79]
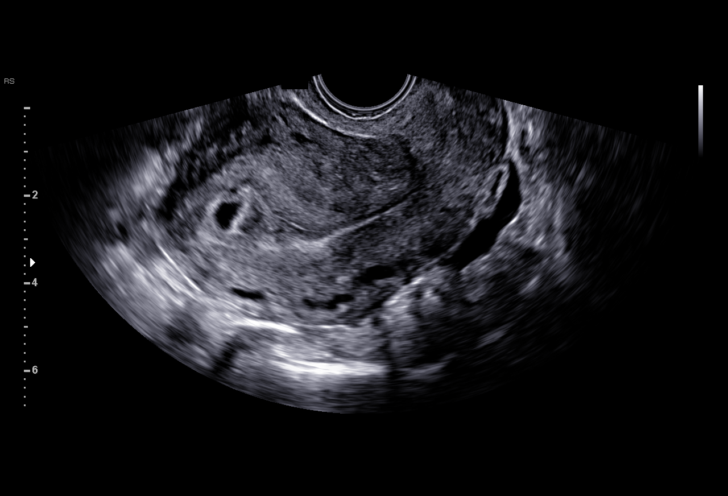
[im 35/79]
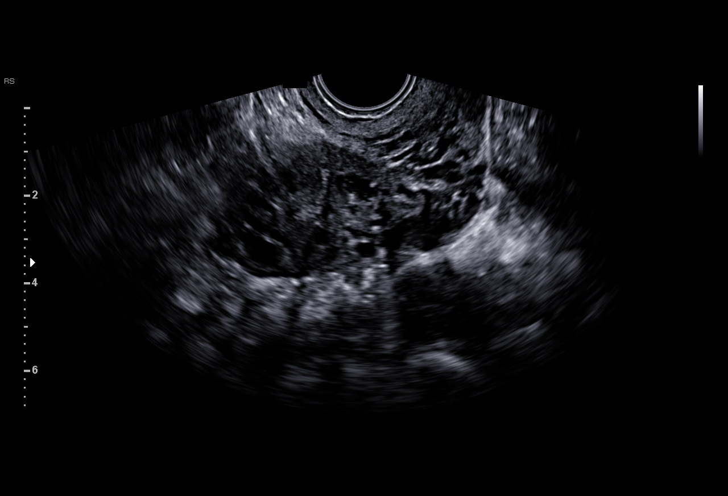
[im 41/79]
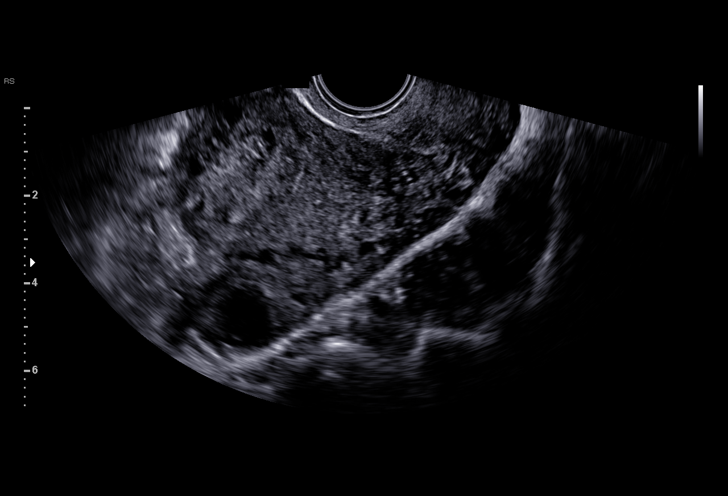
[im 44/79]
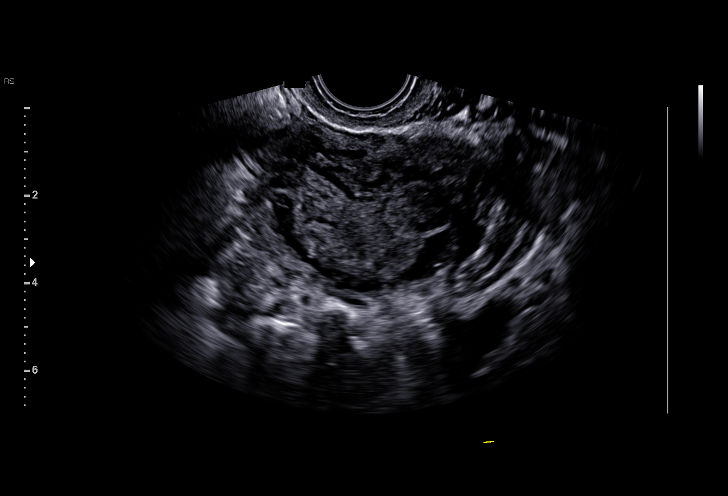
[im 50/79]
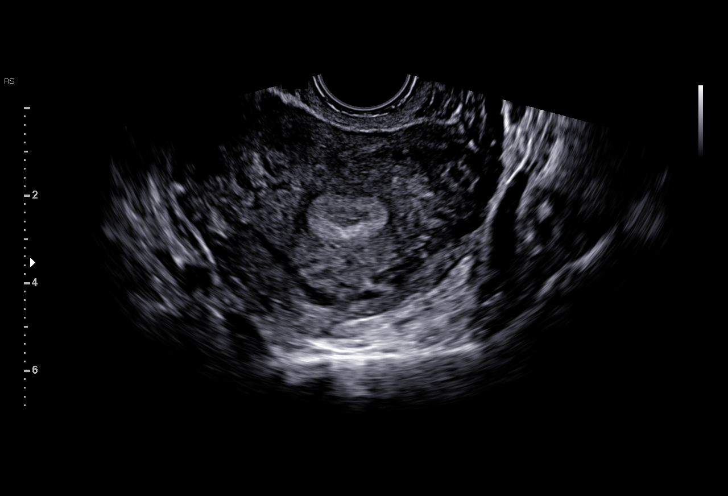
[im 55/79]
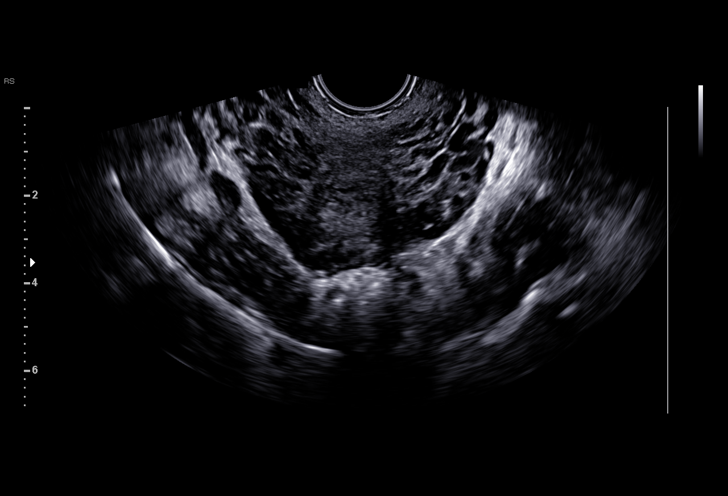
[im 61/79]
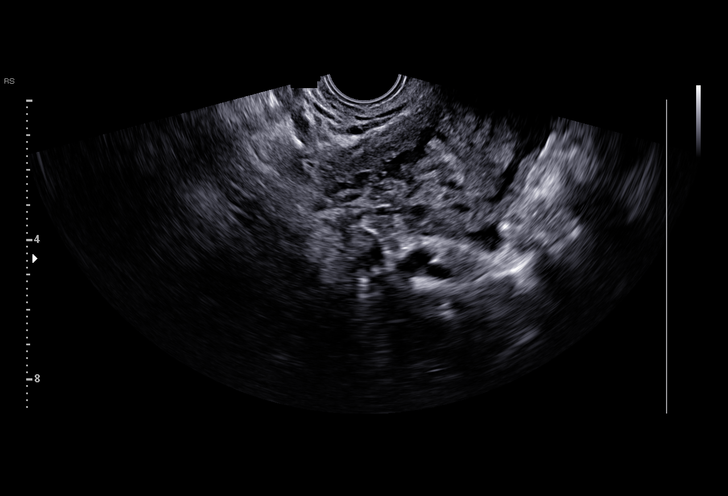
[im 67/79]
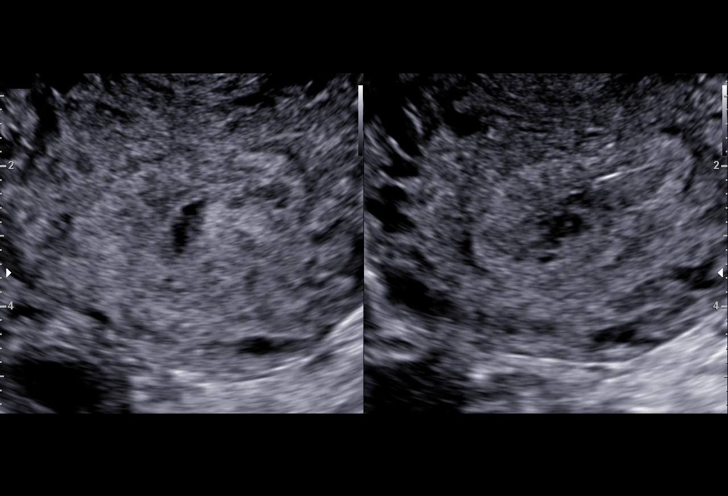
[im 73/79]
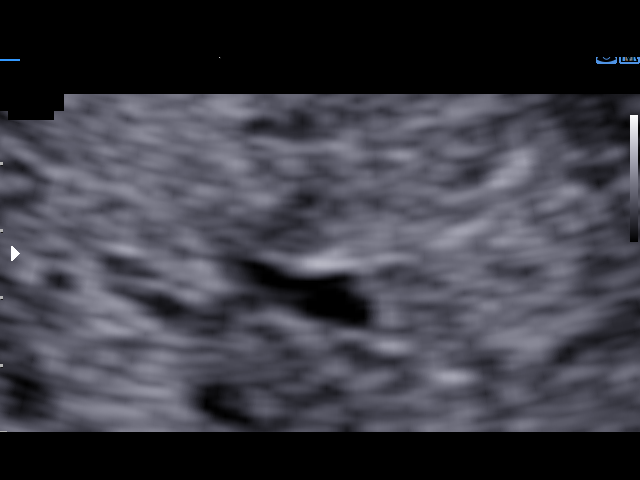
[im 79/79]
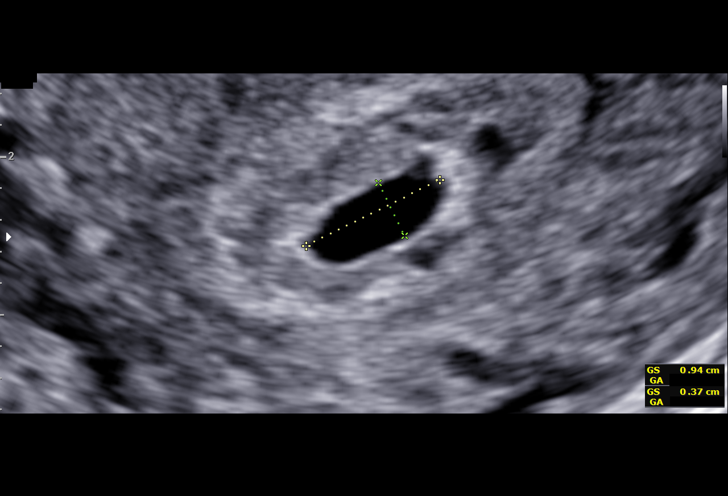

[15 of 28 positions shown; findings below may reference images not displayed]

FINDINGS: Intrauterine gestational sac: A single intrauterine gestational sac
is present.

Yolk sac:  Yolk sac is identified.

Embryo: Small echogenic area is seen which may indicate early fetal
pole. Not definitive.

Cardiac Activity: Not identified.

MSD: 7.3 mm   5 w   3 d

CRL: ? 1.3 mm, measurement is out of range for gestational age
determination.

Subchorionic hemorrhage: A small subchorionic hemorrhage is
identified inferiorly.

Maternal uterus/adnexae: Uterus is anteverted. No myometrial mass
lesions. Both ovaries are visualized and appear normal. Small corpus
luteal cyst on the right. No abnormal pelvic fluid collections.
Minimal free fluid is likely physiologic.
IMPRESSION: 1. Probable early intrauterine gestational sac with a yolk sac and
possible early fetal pole, but no cardiac activity yet visualized.
Recommend follow-up quantitative B-HCG levels and follow-up US in 14
days to assess viability. This recommendation follows SRU consensus
guidelines: Diagnostic Criteria for Nonviable Pregnancy Early in the
First Trimester. N Engl J Med 0261; [DATE].
2. Estimated gestational age based on mean sac diameter is 5 weeks 3
days.
3. Small subchorionic hemorrhage.

## 2018-09-22 ENCOUNTER — Other Ambulatory Visit (HOSPITAL_COMMUNITY)
Admission: RE | Admit: 2018-09-22 | Discharge: 2018-09-22 | Disposition: A | Discharge status: Home / Self Care | Payer: Medicaid Other | Source: Ambulatory Visit | Attending: Certified Nurse Midwife | Admitting: Certified Nurse Midwife

## 2018-09-22 ENCOUNTER — Ambulatory Visit (INDEPENDENT_AMBULATORY_CARE_PROVIDER_SITE_OTHER): Payer: Medicaid Other | Admitting: Certified Nurse Midwife

## 2018-09-22 ENCOUNTER — Other Ambulatory Visit: Payer: Self-pay

## 2018-09-22 ENCOUNTER — Encounter: Payer: Self-pay | Admitting: Certified Nurse Midwife

## 2018-09-22 VITALS — BP 123/72 | HR 98 | Wt 143.9 lb

## 2018-09-22 DIAGNOSIS — Z3480 Encounter for supervision of other normal pregnancy, unspecified trimester: Secondary | ICD-10-CM

## 2018-09-22 DIAGNOSIS — O09299 Supervision of pregnancy with other poor reproductive or obstetric history, unspecified trimester: Secondary | ICD-10-CM

## 2018-09-22 DIAGNOSIS — B9689 Other specified bacterial agents as the cause of diseases classified elsewhere: Secondary | ICD-10-CM

## 2018-09-22 DIAGNOSIS — Z3A37 37 weeks gestation of pregnancy: Secondary | ICD-10-CM

## 2018-09-22 DIAGNOSIS — B373 Candidiasis of vulva and vagina: Secondary | ICD-10-CM

## 2018-09-22 DIAGNOSIS — B3731 Acute candidiasis of vulva and vagina: Secondary | ICD-10-CM

## 2018-09-22 DIAGNOSIS — N760 Acute vaginitis: Secondary | ICD-10-CM

## 2018-09-22 NOTE — Progress Notes (Signed)
Pt presents for ROB.Pt has no concerns. 

## 2018-09-22 NOTE — Patient Instructions (Signed)
Reasons to go to MAU:  1.  Contractions are  5 minutes apart or less, each last 1 minute, these have been going on for 1-2 hours, and you cannot walk or talk during them 2.  You have a large gush of fluid, or a trickle of fluid that will not stop and you have to wear a pad 3.  You have bleeding that is bright red, heavier than spotting--like menstrual bleeding (spotting can be normal in early labor or after a check of your cervix) 4.  You do not feel the baby moving like he/she normally does  

## 2018-09-22 NOTE — Progress Notes (Signed)
   PRENATAL VISIT NOTE  Subjective:  Kerry Jimenez is a 23 y.o. G3P2002 at [redacted]w[redacted]d being seen today for ongoing prenatal care.  She is currently monitored for the following issues for this low-risk pregnancy and has Supervision of other normal pregnancy, antepartum; Hx of preeclampsia, prior pregnancy, currently pregnant; and Short interval between pregnancies affecting pregnancy, antepartum on their problem list.  Patient reports occasional contractions.  Contractions: Irritability. Vag. Bleeding: None.  Movement: Present. Denies leaking of fluid.   The following portions of the patient's history were reviewed and updated as appropriate: allergies, current medications, past family history, past medical history, past social history, past surgical history and problem list.   Objective:   Vitals:   09/22/18 1506  BP: 123/72  Pulse: 98  Weight: 143 lb 14.4 oz (65.3 kg)    Fetal Status: Fetal Heart Rate (bpm): 145 Fundal Height: 36 cm Movement: Present  Presentation: Vertex  General:  Alert, oriented and cooperative. Patient is in no acute distress.  Skin: Skin is warm and dry. No rash noted.   Cardiovascular: Normal heart rate noted  Respiratory: Normal respiratory effort, no problems with respiration noted  Abdomen: Soft, gravid, appropriate for gestational age.  Pain/Pressure: Present     Pelvic: Cervical exam performed Dilation: 1 Effacement (%): Thick Station: Ballotable  Extremities: Normal range of motion.  Edema: None  Mental Status: Normal mood and affect. Normal behavior. Normal judgment and thought content.   Assessment and Plan:  Pregnancy: G3P2002 at [redacted]w[redacted]d 1. Supervision of other normal pregnancy, antepartum - Patient doing well, no complaints - Anticipatory guidance on upcoming appointments  - COVID 19 precautions, discussed next visit being telehealth appointment  - Patient extremely nervous about pandemic due to Uncle and Aunt recently passing from virus in [place].  Reviewed precautions in detail including social distancing and hand washing   - BTL consent signed 09/22/2018 - Strep Gp B NAA - Cervicovaginal ancillary only( [place])  2. Hx of preeclampsia, prior pregnancy, currently pregnant - Normotensive throughout current pregnancy - PEC precautions reviewed   Term labor symptoms and general obstetric precautions including but not limited to vaginal bleeding, contractions, leaking of fluid and fetal movement were reviewed in detail with the patient. Please refer to After Visit Summary for other counseling recommendations.   Return in about 1 week (around 09/29/2018) for TELEHEALTH ROB.  Future Appointments  Date Time Provider Department Center  09/29/2018  8:45 AM Adam Phenix, MD [facility] None    Sharyon Cable, CNM

## 2018-09-24 LAB — STREP GP B NAA: Strep Gp B NAA: NEGATIVE

## 2018-09-26 LAB — CERVICOVAGINAL ANCILLARY ONLY
Bacterial vaginitis: POSITIVE — AB
Candida vaginitis: POSITIVE — AB
Chlamydia: NEGATIVE
Neisseria Gonorrhea: NEGATIVE
Trichomonas: NEGATIVE

## 2018-09-26 MED ORDER — METRONIDAZOLE 0.75 % VA GEL: 1.0000 | Freq: Every day | VAGINAL | 0 refills | Status: DC

## 2018-09-26 MED ORDER — FLUCONAZOLE 150 MG PO TABS: 150.0000 mg | ORAL_TABLET | Freq: Every day | ORAL | 0 refills | Status: DC

## 2018-09-26 NOTE — Addendum Note (Signed)
Addended by: Sharyon Cable on: 09/26/2018 04:40 PM   Modules accepted: Orders

## 2018-09-29 ENCOUNTER — Other Ambulatory Visit: Payer: Self-pay

## 2018-09-29 ENCOUNTER — Encounter: Payer: Self-pay | Admitting: Obstetrics & Gynecology

## 2018-09-29 ENCOUNTER — Ambulatory Visit (INDEPENDENT_AMBULATORY_CARE_PROVIDER_SITE_OTHER): Payer: Medicaid Other | Admitting: Obstetrics & Gynecology

## 2018-09-29 DIAGNOSIS — O09299 Supervision of pregnancy with other poor reproductive or obstetric history, unspecified trimester: Secondary | ICD-10-CM

## 2018-09-29 DIAGNOSIS — O09293 Supervision of pregnancy with other poor reproductive or obstetric history, third trimester: Secondary | ICD-10-CM

## 2018-09-29 DIAGNOSIS — Z3A38 38 weeks gestation of pregnancy: Secondary | ICD-10-CM

## 2018-09-29 DIAGNOSIS — Z3480 Encounter for supervision of other normal pregnancy, unspecified trimester: Secondary | ICD-10-CM

## 2018-09-29 NOTE — Progress Notes (Signed)
S/w pt via tele visit, pt reports fetal movement with occasional pressure and contractions.  Pt currently does not have access to BP cuff.

## 2018-09-29 NOTE — Progress Notes (Signed)
   TELEHEALTH VIRTUAL OBSTETRICS VISIT ENCOUNTER NOTE  I connected with Kerry Milus Mallick on 09/29/18 at  8:45 AM EDT by telephone at home and verified that I am speaking with the correct person using two identifiers.   I discussed the limitations, risks, security and privacy concerns of performing an evaluation and management service by telephone and the availability of in person appointments. I also discussed with the patient that there may be a patient responsible charge related to this service. The patient expressed understanding and agreed to proceed.  Subjective:  Kerry Jimenez is a 23 y.o. G3P2002 at [redacted]w[redacted]d being followed for ongoing prenatal care.  She is currently monitored for the following issues for this high-risk pregnancy and has Supervision of other normal pregnancy, antepartum; Hx of preeclampsia, prior pregnancy, currently pregnant; and Short interval between pregnancies affecting pregnancy, antepartum on their problem list.  Patient reports pressure, stretching. Reports fetal movement. Denies any contractions, bleeding or leaking of fluid.   The following portions of the patient's history were reviewed and updated as appropriate: allergies, current medications, past family history, past medical history, past social history, past surgical history and problem list.   Objective:   General:  Alert, oriented and cooperative.   Mental Status: Normal mood and affect perceived. Normal judgment and thought content.  Rest of physical exam deferred due to type of encounter  Assessment and Plan:  Pregnancy: G3P2002 at [redacted]w[redacted]d 1. Supervision of other normal pregnancy, antepartum Needs to record BP - Babyscripts Schedule Optimization  2. Hx of preeclampsia, prior pregnancy, currently pregnant   Term labor symptoms and general obstetric precautions including but not limited to vaginal bleeding, contractions, leaking of fluid and fetal movement were reviewed in detail with the patient.   I discussed the assessment and treatment plan with the patient. The patient was provided an opportunity to ask questions and all were answered. The patient agreed with the plan and demonstrated an understanding of the instructions. The patient was advised to call back or seek an in-person office evaluation/go to MAU at [facility] for any urgent or concerning symptoms. Please refer to After Visit Summary for other counseling recommendations.   I provided 10 minutes of non-face-to-face time during this encounter.  Return in about 1 week (around 10/06/2018) for she needs to pick up BP cuff.  No future appointments.  Scheryl Darter, MD Center for [facility], [facility]

## 2018-10-07 ENCOUNTER — Ambulatory Visit (INDEPENDENT_AMBULATORY_CARE_PROVIDER_SITE_OTHER): Payer: Medicaid Other | Admitting: Obstetrics and Gynecology

## 2018-10-07 ENCOUNTER — Encounter: Payer: Self-pay | Admitting: Obstetrics and Gynecology

## 2018-10-07 ENCOUNTER — Encounter (HOSPITAL_COMMUNITY): Payer: Self-pay

## 2018-10-07 ENCOUNTER — Inpatient Hospital Stay (HOSPITAL_COMMUNITY)
Admission: AD | Admit: 2018-10-07 | Discharge: 2018-10-09 | DRG: 807 | Disposition: A | Discharge status: Home / Self Care | Payer: Medicaid Other | Attending: Obstetrics & Gynecology | Admitting: Obstetrics & Gynecology

## 2018-10-07 ENCOUNTER — Inpatient Hospital Stay (HOSPITAL_COMMUNITY): Payer: Medicaid Other | Admitting: Anesthesiology

## 2018-10-07 ENCOUNTER — Other Ambulatory Visit: Payer: Self-pay

## 2018-10-07 DIAGNOSIS — Z3A39 39 weeks gestation of pregnancy: Secondary | ICD-10-CM

## 2018-10-07 DIAGNOSIS — O09299 Supervision of pregnancy with other poor reproductive or obstetric history, unspecified trimester: Secondary | ICD-10-CM

## 2018-10-07 DIAGNOSIS — Z3480 Encounter for supervision of other normal pregnancy, unspecified trimester: Secondary | ICD-10-CM

## 2018-10-07 DIAGNOSIS — O26893 Other specified pregnancy related conditions, third trimester: Secondary | ICD-10-CM | POA: Diagnosis present

## 2018-10-07 DIAGNOSIS — O09899 Supervision of other high risk pregnancies, unspecified trimester: Secondary | ICD-10-CM

## 2018-10-07 LAB — CBC
HCT: 34.4 % — ABNORMAL LOW (ref 36.0–46.0)
Hemoglobin: 10.8 g/dL — ABNORMAL LOW (ref 12.0–15.0)
MCH: 26.7 pg (ref 26.0–34.0)
MCHC: 31.4 g/dL (ref 30.0–36.0)
MCV: 85.1 fL (ref 80.0–100.0)
Platelets: 172 10*3/uL (ref 150–400)
RBC: 4.04 MIL/uL (ref 3.87–5.11)
RDW: 14.6 % (ref 11.5–15.5)
WBC: 5.5 10*3/uL (ref 4.0–10.5)
nRBC: 0 % (ref 0.0–0.2)

## 2018-10-07 LAB — TYPE AND SCREEN
ABO/RH(D): O POS
Antibody Screen: NEGATIVE

## 2018-10-07 MED ORDER — OXYTOCIN 40 UNITS IN NORMAL SALINE INFUSION - SIMPLE MED
2.5000 [IU]/h | INTRAVENOUS | Status: DC
  Filled 2018-10-07: qty 1000

## 2018-10-07 MED ORDER — FENTANYL-BUPIVACAINE-NACL 0.5-0.125-0.9 MG/250ML-% EP SOLN
12.0000 mL/h | EPIDURAL | Status: DC | PRN
  Filled 2018-10-07: qty 250

## 2018-10-07 MED ORDER — LIDOCAINE HCL (PF) 1 % IJ SOLN
INTRAMUSCULAR | Status: DC | PRN
  Administered 2018-10-07: 11 mL via EPIDURAL

## 2018-10-07 MED ORDER — FENTANYL CITRATE (PF) 100 MCG/2ML IJ SOLN: 100.0000 ug | INTRAMUSCULAR | Status: DC | PRN

## 2018-10-07 MED ORDER — EPHEDRINE 5 MG/ML INJ: 10.0000 mg | INTRAVENOUS | Status: DC | PRN

## 2018-10-07 MED ORDER — OXYCODONE-ACETAMINOPHEN 5-325 MG PO TABS: 1.0000 | ORAL_TABLET | ORAL | Status: DC | PRN

## 2018-10-07 MED ORDER — SODIUM CHLORIDE (PF) 0.9 % IJ SOLN
INTRAMUSCULAR | Status: DC | PRN
  Administered 2018-10-07: 14 mL/h via EPIDURAL

## 2018-10-07 MED ORDER — OXYTOCIN BOLUS FROM INFUSION
500.0000 mL | Freq: Once | INTRAVENOUS | Status: AC
  Administered 2018-10-07: 500 mL via INTRAVENOUS

## 2018-10-07 MED ORDER — OXYCODONE-ACETAMINOPHEN 5-325 MG PO TABS: 2.0000 | ORAL_TABLET | ORAL | Status: DC | PRN

## 2018-10-07 MED ORDER — ACETAMINOPHEN 325 MG PO TABS: 650.0000 mg | ORAL_TABLET | ORAL | Status: DC | PRN

## 2018-10-07 MED ORDER — LACTATED RINGERS IV SOLN: 500.0000 mL | INTRAVENOUS | Status: DC | PRN

## 2018-10-07 MED ORDER — PHENYLEPHRINE 40 MCG/ML (10ML) SYRINGE FOR IV PUSH (FOR BLOOD PRESSURE SUPPORT): 80.0000 ug | PREFILLED_SYRINGE | INTRAVENOUS | Status: DC | PRN

## 2018-10-07 MED ORDER — SOD CITRATE-CITRIC ACID 500-334 MG/5ML PO SOLN: 30.0000 mL | ORAL | Status: DC | PRN

## 2018-10-07 MED ORDER — LACTATED RINGERS IV SOLN
500.0000 mL | Freq: Once | INTRAVENOUS | Status: AC
  Administered 2018-10-07: 20:00:00 500 mL via INTRAVENOUS

## 2018-10-07 MED ORDER — LACTATED RINGERS IV SOLN
INTRAVENOUS | Status: DC
  Administered 2018-10-07: 20:00:00 125 mL/h via INTRAVENOUS

## 2018-10-07 MED ORDER — LIDOCAINE HCL (PF) 1 % IJ SOLN: 30.0000 mL | INTRAMUSCULAR | Status: DC | PRN

## 2018-10-07 MED ORDER — DIPHENHYDRAMINE HCL 50 MG/ML IJ SOLN
12.5000 mg | INTRAMUSCULAR | Status: DC | PRN
  Administered 2018-10-08: 01:00:00 12.5 mg via INTRAVENOUS
  Filled 2018-10-07: qty 1

## 2018-10-07 MED ORDER — ONDANSETRON HCL 4 MG/2ML IJ SOLN: 4.0000 mg | Freq: Four times a day (QID) | INTRAMUSCULAR | Status: DC | PRN

## 2018-10-07 NOTE — Anesthesia Procedure Notes (Signed)
Epidural Patient location during procedure: OB Start time: 10/07/2018 8:47 PM End time: 10/07/2018 8:59 PM  Staffing Anesthesiologist: Lowella Curb, MD Performed: anesthesiologist   Preanesthetic Checklist Completed: patient identified, site marked, surgical consent, pre-op evaluation, timeout performed, IV checked, risks and benefits discussed and monitors and equipment checked  Epidural Patient position: sitting Prep: ChloraPrep Patient monitoring: heart rate, cardiac monitor, continuous pulse ox and blood pressure Approach: midline Location: L2-L3 Injection technique: LOR saline  Needle:  Needle type: Tuohy  Needle gauge: 17 G Needle length: 9 cm Needle insertion depth: 4 cm Catheter type: closed end flexible Catheter size: 20 Guage Catheter at skin depth: 8 cm Test dose: negative  Assessment Events: blood not aspirated, injection not painful, no injection resistance, negative IV test and no paresthesia  Additional Notes Reason for block:procedure for pain

## 2018-10-07 NOTE — Discharge Summary (Signed)
Postpartum Discharge Summary     Patient Name: Kerry Jimenez DOB: [birth year] MRN: [id number]  Date of admission: 10/07/2018 Delivering Provider: Sharyon CableOGERS, VERONICA C   Date of discharge: 10/09/2018  Admitting diagnosis: ctx 2 min  Intrauterine pregnancy: 2290w2d     Secondary diagnosis:  Active Problems:   Hx of preeclampsia, prior pregnancy, currently pregnant   Short interval between pregnancies affecting pregnancy, antepartum   Indication for care in labor or delivery   SVD (spontaneous vaginal delivery)  Additional problems: Precipitous delivery      Discharge diagnosis: Term Pregnancy Delivered                                                                                                Post partum procedures:none  Augmentation: none  Complications: None  Hospital course:  Onset of Labor With Vaginal Delivery     23 y.o. yo [id number] at 290w2d was admitted in Active Labor on 10/07/2018. Patient had an uncomplicated labor course as follows:  Membrane Rupture Time/Date: 10:20 PM ,10/07/2018   Intrapartum Procedures: Episiotomy: None [1]                                         Lacerations:  None [1]  Patient had a delivery of a Viable infant. 10/07/2018  Information for the patient's newborn:  Quinn Axeurner, Boy Ny Jeria [[id number]][[id number]]  Delivery Method: Vag-Spont(filed from delivery)    Pateint had an uncomplicated postpartum course.  She is ambulating, tolerating a regular diet, passing flatus, and urinating well. Wants BTL, declined depo in the interim  Patient is discharged home in stable condition on 10/09/18.   Magnesium Sulfate recieved: No BMZ received: No  Physical exam  Vitals:   10/08/18 0907 10/08/18 1524 10/08/18 2125 10/09/18 0500  BP: 135/75 102/66 111/81 121/74  Pulse: 72 61 61 63  Resp:  18 18 16   Temp:  98.3 F (36.8 C)  (!) 97.4 F (36.3 C)  TempSrc:  Oral  Oral  SpO2: 99% 100%  100%  Weight:      Height:       General: alert, cooperative and no  distress Lochia: appropriate Uterine Fundus: firm Incision: N/A DVT Evaluation: No evidence of DVT seen on physical exam. Negative Homan's sign. No cords or calf tenderness. Labs: Lab Results  Component Value Date   WBC 5.5 10/07/2018   HGB 10.8 (L) 10/07/2018   HCT 34.4 (L) 10/07/2018   MCV 85.1 10/07/2018   PLT 172 10/07/2018   CMP Latest Ref Rng & Units 04/29/2018  Glucose 65 - 99 mg/dL 67  BUN 6 - 20 mg/dL 6  Creatinine 2.950.57 - 2.841.00 mg/dL 1.320.70  Sodium 440134 - 102144 mmol/L 138  Potassium 3.5 - 5.2 mmol/L 4.3  Chloride 96 - 106 mmol/L 105  CO2 20 - 29 mmol/L 21  Calcium 8.7 - 10.2 mg/dL 9.6  Total Protein 6.0 - 8.5 g/dL 6.6  Total Bilirubin 0.0 - 1.2 mg/dL 0.4  Alkaline Phos 39 - 117 IU/L  37(L)  AST 0 - 40 IU/L 12  ALT 0 - 32 IU/L 14    Discharge instruction: per After Visit Summary and "Baby and Me Booklet".  After visit meds:  Allergies as of 10/09/2018   No Known Allergies     Medication List    STOP taking these medications   acetaminophen 325 MG tablet Commonly known as:  Tylenol   aspirin 81 MG chewable tablet   aspirin EC 81 MG tablet   Doxylamine-Pyridoxine 10-10 MG Tbec Commonly known as:  Diclegis   ferrous sulfate 325 (65 FE) MG tablet   fluconazole 150 MG tablet Commonly known as:  Diflucan   metroNIDAZOLE 0.75 % vaginal gel Commonly known as:  METROGEL   multivitamin-prenatal 27-0.8 MG Tabs tablet     TAKE these medications   ibuprofen 600 MG tablet Commonly known as:  ADVIL Take 1 tablet (600 mg total) by mouth every 6 (six) hours.   norethindrone 0.35 MG tablet Commonly known as:  MICRONOR Start on the Sunday after baby turns 76 weeks old       Diet: routine diet  Activity: Advance as tolerated. Pelvic rest for 6 weeks.   Outpatient follow up: Follow up Appt: No future appointments. Follow up Visit: Follow-up Information    [facility] Schedule an appointment as soon as possible for a visit in 4  week(s).   Specialty:  Obstetrics and Gynecology Why:  postpartum checkup Contact information: [address], [address] [place] [address] [phone]           Please schedule this patient for Postpartum visit in: 4 weeks with the following provider: APP For C/S patients schedule nurse incision check in weeks 2 weeks: no Low risk pregnancy complicated by: nothing Delivery mode:  SVD Anticipated Birth Control:  NuvaRing until interval BTL PP Procedures needed: none  Schedule Integrated BH visit: no  Newborn Data: Live born female  Birth Weight:   APGAR: 8, 9  Newborn Delivery   Birth date/time:  10/07/2018 22:34:00 Delivery type:  Vaginal, Spontaneous     Baby Feeding: Breast Disposition:home with mother   10/09/2018 Jacklyn Shell, CNM

## 2018-10-07 NOTE — MAU Note (Signed)
Ctx started at 3pm, back to back now. Rates pain 6/10.

## 2018-10-07 NOTE — Progress Notes (Signed)
   TELEHEALTH [facility] OBSTETRICS VISIT ENCOUNTER NOTE  I connected with@ on 10/07/18 at 10:15 AM EDT via WebEx at home and verified that I am speaking with the correct person using two identifiers.   I discussed the limitations, risks, security and privacy concerns of performing an evaluation and management service by telephone and the availability of in person appointments. I also discussed with the patient that there may be a patient responsible charge related to this service. The patient expressed understanding and agreed to proceed.  Subjective:  Kerry Jimenez is a 23 y.o. G3P2002 at [redacted]w[redacted]d being followed for ongoing prenatal care.  She is currently monitored for the following issues for this low-risk pregnancy and has Supervision of other normal pregnancy, antepartum; Hx of preeclampsia, prior pregnancy, currently pregnant; and Short interval between pregnancies affecting pregnancy, antepartum on their problem list.  Patient reports general discomfort of pregnancy. Reports fetal movement. Denies any contractions, bleeding or leaking of fluid.   The following portions of the patient's history were reviewed and updated as appropriate: allergies, current medications, past family history, past medical history, past social history, past surgical history and problem list.   Objective:  LMP 01/05/2018  Reviewed from Babyscripts General:  Alert, oriented and cooperative. Patient is in no acute distress.  Mental Status: Normal mood and affect. Normal behavior. Normal judgment and thought content.   Respiratory: Normal respiratory effort noted, no problems with respiration noted  Rest of physical exam deferred due to type of encounter  Assessment and Plan:  Pregnancy: G3P2002 at [redacted]w[redacted]d 1. Supervision of other normal pregnancy, antepartum Stable Labor precautions IOL scheduled at 41 weeks  2. Hx of preeclampsia, prior pregnancy, currently pregnant  Has not been able to monitor BP as she just  moved and has not be able to locate her cuff  Term labor symptoms and general obstetric precautions including but not limited to vaginal bleeding, contractions, leaking of fluid and fetal movement were reviewed in detail with the patient.  I discussed the assessment and treatment plan with the patient. The patient was provided an opportunity to ask questions and all were answered. The patient agreed with the plan and demonstrated an understanding of the instructions. The patient was advised to call back or seek an in-person office evaluation/go to [facility] at [facility] for any urgent or concerning symptoms. Please refer to After Visit Summary for other counseling recommendations.   I provided 11 minutes of face-to-face time via WebEx during this encounter.  Return in about 1 week (around 10/14/2018) for OB visit, televisit.  Future Appointments  Date Time Provider Department Center  10/19/2018  7:30 AM [facility] SCHED ROOM [facility] None    Hermina Staggers, MD Center for [facility], [facility]

## 2018-10-07 NOTE — Progress Notes (Signed)
Pt presents for webex visit. Pt identified with 2 pt identifiers. Pt is [redacted]w[redacted]d. Pt has not checked her bp. She states that she just moved and has not been able to locate her bp cuff. Pt has no concerns.

## 2018-10-07 NOTE — Anesthesia Preprocedure Evaluation (Signed)

## 2018-10-07 NOTE — H&P (Signed)
LABOR AND DELIVERY ADMISSION HISTORY AND PHYSICAL NOTE  Kerry Jimenez is a 23 y.o. female 753P2002 with IUP at 5391w2d by LMP presenting for SOL.  She reports positive fetal movement. She denies leakage of fluid or vaginal bleeding.  Prenatal History/Complications: PNC at [facility]  Pregnancy complications:  - history of pre-eclampsia in prior pregnancy  -short intervals between pregnancies   Past Medical History: Past Medical History:  Diagnosis Date  . Anemia     Past Surgical History: Past Surgical History:  Procedure Laterality Date  . NO PAST SURGERIES      Obstetrical History: OB History    Gravida  3   Para  2   Term  2   Preterm  0   AB  0   Living  2     SAB  0   TAB  0   Ectopic  0   Multiple  0   Live Births  2           Social History: Social History   Socioeconomic History  . Marital status: Single    Spouse name: Not on file  . Number of children: 0  . Years of education: Not on file  . Highest education level: Not on file  Occupational History  . Occupation: ChiropodistDinning services  Social Needs  . Financial resource strain: Not on file  . Food insecurity:    Worry: Not on file    Inability: Not on file  . Transportation needs:    Medical: Not on file    Non-medical: Not on file  Tobacco Use  . Smoking status: Never Smoker  . Smokeless tobacco: Never Used  Substance and Sexual Activity  . Alcohol use: Yes    Alcohol/week: 0.0 standard drinks    Comment: occassionally, not since positive pregnancy test  . Drug use: No  . Sexual activity: Yes    Partners: Male    Birth control/protection: None  Lifestyle  . Physical activity:    Days per week: Not on file    Minutes per session: Not on file  . Stress: Not on file  Relationships  . Social connections:    Talks on phone: Not on file    Gets together: Not on file    Attends religious service: Not on file    Active member of club or organization: Not on file    Attends  meetings of clubs or organizations: Not on file    Relationship status: Not on file  Other Topics Concern  . Not on file  Social History Narrative  . Not on file    Family History: Family History  Problem Relation Age of Onset  . Diabetes Mother   . Hypertension Mother   . Hyperlipidemia Mother   . Arthritis Mother   . Sickle cell anemia Mother   . Hypertension Other   . Diabetes Other   . Cancer Paternal Grandmother   . Sickle cell anemia Brother   . Arthritis Maternal Aunt   . Heart disease Maternal Uncle   . Heart disease Paternal Aunt   . Sickle cell trait Brother   . Hyperlipidemia Sister   . Arthritis Maternal Aunt   . Hyperlipidemia Maternal Aunt   . Diabetes Paternal Aunt   . Diabetes Paternal Aunt     Allergies: No Known Allergies  Medications Prior to Admission  Medication Sig Dispense Refill Last Dose  . fluconazole (DIFLUCAN) 150 MG tablet Take 1 tablet (150 mg total) by  mouth daily. 1 tablet 0 Past Week at Unknown time  . metroNIDAZOLE (METROGEL) 0.75 % vaginal gel Place 1 Applicatorful vaginally at bedtime. Apply one applicatorful to vagina at bedtime for 5 days 70 g 0 10/06/2018 at Unknown time  . Prenatal Vit-Fe Fumarate-FA (MULTIVITAMIN-PRENATAL) 27-0.8 MG TABS tablet Take 1 tablet by mouth daily at 12 noon.   10/06/2018 at Unknown time  . acetaminophen (TYLENOL) 325 MG tablet Take 2 tablets (650 mg total) by mouth every 4 (four) hours as needed (for pain scale < 4). 60 tablet 0 Taking  . aspirin 81 MG chewable tablet Chew 1 tablet (81 mg total) by mouth daily. (Patient not taking: Reported on 10/07/2018) 30 tablet 11 Not Taking  . aspirin EC 81 MG tablet Take 1 tablet (81 mg total) by mouth daily. Take after 12 weeks for prevention of preeclampsia later in pregnancy (Patient not taking: Reported on 10/07/2018) 300 tablet 2 Not Taking  . Doxylamine-Pyridoxine (DICLEGIS) 10-10 MG TBEC 1 tab in AM, 1 tab mid afternoon 2 tabs at bedtime. Max dose 4 tabs daily. 100  tablet 5 Taking  . ferrous sulfate 325 (65 FE) MG tablet Take 1 tablet (325 mg total) by mouth 2 (two) times daily with a meal. 60 tablet 5 Taking     Review of Systems  All systems reviewed and negative except as stated in HPI  Physical Exam Blood pressure 132/66, pulse 87, temperature 98.4 F (36.9 C), temperature source Oral, resp. rate 18, weight 66.4 kg, last menstrual period 01/05/2018, SpO2 100 %, currently breastfeeding. General appearance: alert, oriented, NAD Lungs: normal respiratory effort Heart: regular rate Abdomen: soft, non-tender; gravid, FH appropriate for GA Extremities: No calf swelling or tenderness Presentation: cephalic Fetal monitoring: 130s, + accels, no decels, Cat 1 Uterine activity: ctx q 3-4 mins Dilation: 4 Effacement (%): 80 Station: -3 Exam by:: Camelia Eng RN  Prenatal labs: ABO, Rh: O/Positive/-- (10/29 1132) Antibody: Negative (10/29 1132) Rubella: 5.52 (10/29 1132) RPR: Non Reactive (02/07 1034)  HBsAg: Negative (10/29 1132)  HIV: Non Reactive (02/07 1034)  GC/Chlamydia: Negative  GBS: Negative (04/09 0306)  2-hr GTT: Normal  Genetic screening:  Negative  Anatomy US: Normal   Prenatal Transfer Tool  Maternal Diabetes: No Genetic Screening: Normal Maternal Ultrasounds/Referrals: Normal Fetal Ultrasounds or other Referrals:  None Maternal Substance Abuse:  No Significant Maternal Medications:  None Significant Maternal Lab Results: Lab values include: Group B Strep negative  No results found for this or any previous visit (from the past 24 hour(s)).  Patient Active Problem List   Diagnosis Date Noted  . Hx of preeclampsia, prior pregnancy, currently pregnant 04/29/2018  . Short interval between pregnancies affecting pregnancy, antepartum 04/29/2018  . Supervision of other normal pregnancy, antepartum 03/29/2018    Assessment: Kerry Jimenez is a 23 y.o. G3P2002 at [redacted]w[redacted]d here for SOL.   #Labor: Expectant  management #Pain: Planning epidural placement #ID:  GBS neg  #MOF: breast #MOC:NuvaRing (BTL papers signed 09/22/18)   De Hollingshead 10/07/2018, 7:38 PM   Arabella Merles CNM 10/07/2018 9:13 PM

## 2018-10-08 ENCOUNTER — Encounter (HOSPITAL_COMMUNITY): Payer: Self-pay

## 2018-10-08 LAB — ABO/RH: ABO/RH(D): O POS

## 2018-10-08 LAB — RPR: RPR Ser Ql: NONREACTIVE

## 2018-10-08 MED ORDER — ONDANSETRON HCL 4 MG PO TABS: 4.0000 mg | ORAL_TABLET | ORAL | Status: DC | PRN

## 2018-10-08 MED ORDER — ONDANSETRON HCL 4 MG/2ML IJ SOLN: 4.0000 mg | INTRAMUSCULAR | Status: DC | PRN

## 2018-10-08 MED ORDER — SENNOSIDES-DOCUSATE SODIUM 8.6-50 MG PO TABS
2.0000 | ORAL_TABLET | ORAL | Status: DC
  Administered 2018-10-08 – 2018-10-09 (×2): 2 via ORAL
  Filled 2018-10-08 (×3): qty 2

## 2018-10-08 MED ORDER — WITCH HAZEL-GLYCERIN EX PADS: 1.0000 "application " | MEDICATED_PAD | CUTANEOUS | Status: DC | PRN

## 2018-10-08 MED ORDER — TETANUS-DIPHTH-ACELL PERTUSSIS 5-2.5-18.5 LF-MCG/0.5 IM SUSP: 0.5000 mL | Freq: Once | INTRAMUSCULAR | Status: DC

## 2018-10-08 MED ORDER — DIPHENHYDRAMINE HCL 25 MG PO CAPS: 25.0000 mg | ORAL_CAPSULE | Freq: Four times a day (QID) | ORAL | Status: DC | PRN

## 2018-10-08 MED ORDER — DIBUCAINE (PERIANAL) 1 % EX OINT: 1.0000 "application " | TOPICAL_OINTMENT | CUTANEOUS | Status: DC | PRN

## 2018-10-08 MED ORDER — SIMETHICONE 80 MG PO CHEW: 80.0000 mg | CHEWABLE_TABLET | ORAL | Status: DC | PRN

## 2018-10-08 MED ORDER — ZOLPIDEM TARTRATE 5 MG PO TABS: 5.0000 mg | ORAL_TABLET | Freq: Every evening | ORAL | Status: DC | PRN

## 2018-10-08 MED ORDER — ACETAMINOPHEN 325 MG PO TABS
650.0000 mg | ORAL_TABLET | ORAL | Status: DC | PRN
  Administered 2018-10-08 – 2018-10-09 (×4): 650 mg via ORAL
  Filled 2018-10-08 (×4): qty 2

## 2018-10-08 MED ORDER — BENZOCAINE-MENTHOL 20-0.5 % EX AERO
1.0000 "application " | INHALATION_SPRAY | CUTANEOUS | Status: DC | PRN
  Administered 2018-10-08: 1 via TOPICAL
  Filled 2018-10-08: qty 56

## 2018-10-08 MED ORDER — PRENATAL MULTIVITAMIN CH
1.0000 | ORAL_TABLET | Freq: Every day | ORAL | Status: DC
  Administered 2018-10-08 – 2018-10-09 (×2): 1 via ORAL
  Filled 2018-10-08 (×2): qty 1

## 2018-10-08 MED ORDER — IBUPROFEN 600 MG PO TABS
600.0000 mg | ORAL_TABLET | Freq: Four times a day (QID) | ORAL | Status: DC
  Administered 2018-10-08 – 2018-10-09 (×6): 600 mg via ORAL
  Filled 2018-10-08 (×8): qty 1

## 2018-10-08 MED ORDER — COCONUT OIL OIL: 1.0000 "application " | TOPICAL_OIL | Status: DC | PRN

## 2018-10-08 NOTE — Progress Notes (Signed)
Post Partum Day 1 Subjective: no complaints, up ad lib, voiding and tolerating PO  Objective: Blood pressure 115/62, pulse 77, temperature 98.1 F (36.7 C), temperature source Oral, resp. rate 18, height 5' (1.524 m), weight 66.2 kg, last menstrual period 01/05/2018, SpO2 99 %, unknown if currently breastfeeding.  Physical Exam:  General: alert, cooperative and no distress Lochia: appropriate Uterine Fundus: firm Incision: perineum intact DVT Evaluation: No evidence of DVT seen on physical exam.  Recent Labs    10/07/18 1943  HGB 10.8*  HCT 34.4*   Vitals:   10/08/18 0031 10/08/18 0115 10/08/18 0230 10/08/18 0548  BP: 97/69 110/65 124/61 115/62  Pulse: 68 69 81 77  Resp: 16 18 18 18   Temp:  98.9 F (37.2 C) 98.4 F (36.9 C) 98.1 F (36.7 C)  TempSrc:  Oral Oral Oral  SpO2:  100% 99% 99%  Weight:      Height:        Assessment/Plan: Plan for discharge tomorrow and Breastfeeding   LOS: 1 day   Wynelle Bourgeois 10/08/2018, 8:50 AM

## 2018-10-08 NOTE — Anesthesia Postprocedure Evaluation (Signed)
Anesthesia Post Note  Patient: Kerry Jimenez  Procedure(s) Performed: AN AD HOC LABOR EPIDURAL     Patient location during evaluation: Mother Baby Anesthesia Type: Epidural Level of consciousness: awake and alert Pain management: pain level controlled Vital Signs Assessment: post-procedure vital signs reviewed and stable Respiratory status: spontaneous breathing, nonlabored ventilation and respiratory function stable Cardiovascular status: stable Postop Assessment: no headache, no backache, epidural receding, no apparent nausea or vomiting, patient able to bend at knees, able to ambulate and adequate PO intake Anesthetic complications: no    Last Vitals:  Vitals:   10/08/18 0230 10/08/18 0548  BP: 124/61 115/62  Pulse: 81 77  Resp: 18 18  Temp: 36.9 C 36.7 C  SpO2: 99% 99%    Last Pain:  Vitals:   10/08/18 0729  TempSrc:   PainSc: 4    Pain Goal:                   Laban Emperor

## 2018-10-08 NOTE — Discharge Instructions (Signed)
What You Need to Know About Female Sterilization  Female sterilization is surgery to prevent pregnancy. In this surgery, the fallopian tubes are either blocked or closed off. This prevents eggs from reaching the uterus so that the eggs cannot be fertilized by sperm and you cannot get pregnant.  Sterilization is permanent. It should only be done if you are sure that you do not want to be able to have children.  What are the sterilization surgery options?  There are several kinds of female sterilization surgeries. They include:   Laparoscopic tubal ligation. In this surgery, the fallopian tubes are tied off, sealed with heat, or blocked with a clip, ring, or clamp. A small portion of each fallopian tube may also be removed. This surgery is done through several small cuts (incisions).   Postpartum tubal ligation. This is also called a mini-laparotomy. This surgery is done right after childbirth or 1 or 2 days after childbirth. In this surgery, the fallopian tubes are tied off, sealed with heat, or blocked with a clip, ring, or clamp. A small portion of each fallopian tube may also be removed. The surgery is done through a single incision.   Hysteroscopic sterilization. In this surgery, a tiny, spring-like coil is inserted through the cervix and uterus into the fallopian tubes. The coil causes scarring, which blocks the tubes. After the surgery, contraception should be used for 3 months to allow the scar tissue to form completely.  Is sterilization safe?  Generally, sterilization is safe. Complications are rare. However, there are risks. They include:   Bleeding.   Infection.   Reaction to medicine used during the procedure.   Injury to surrounding organs.   Failure of the procedure.  How effective is sterilization?  Sterilization is nearly 100% effective, but it can fail. Also, the fallopian tubes can grow back together over time. If this happens, you will be able to get pregnant again.  Women who have had this  procedure have a higher chance of having an ectopic pregnancy. An ectopic pregnancy is a pregnancy that happens outside of the uterus. This kind of pregnancy is unsuccessful and can lead to serious bleeding if it is not treated.  What are the benefits?   It is usually effective for a lifetime.   It is usually safe.   It does not have the drawbacks of other types of birth control: That means:  ? Your hormones are not affected. Because of this, your menstrual periods, sexual desire, and sexual performance will not be affected.  ? There are no side effects.  What are the drawbacks?   If you change your mind and decide that you want to have children, you may not be able to. Sterilization may be reversed, but a reversal is not always successful.   It does not provide protection against STDs (sexually transmitted diseases).   It increases the chance of having an ectopic pregnancy.  This information is not intended to replace advice given to you by your health care provider. Make sure you discuss any questions you have with your health care provider.  Document Released: 11/18/2007 Document Revised: 01/23/2016 Document Reviewed: 02/26/2015  Elsevier Interactive Patient Education  2019 Elsevier Inc.

## 2018-10-09 MED ORDER — NORETHINDRONE 0.35 MG PO TABS: ORAL_TABLET | ORAL | 11 refills | Status: DC

## 2018-10-09 MED ORDER — IBUPROFEN 600 MG PO TABS: 600.0000 mg | ORAL_TABLET | Freq: Four times a day (QID) | ORAL | 0 refills | Status: DC

## 2018-10-09 NOTE — Lactation Note (Signed)
This note was copied from a baby's chart. Lactation Consultation Note:  Mother is a P3, she reports breastfeeding her daughters for 6 months each. Mother has a 24 yr old and a 1 yrs old.   Mother reports that her nipples are slightly sore. No observed cracking.  Mother reports that she feels discomfort on the initial latch.   Mother has been using traditional cradle hold. Suggested alternate positions. Mother agreeable to use football hold.   Assist mother with football hold. Advised to use good pillow support. Mother taught to do off sided latch technique.  Mothers breast are filling she is able to express large drops of colostrum.  Infant opened wide with good deep latch . Infant sustained latch for 30 mins with observed swallowing.  Mother taught to do breast compression and observed frequent suckling and swallows . Mother very happy that latch didn't hurt and she doesn't have pain.    Lots of support given and teaching done. Lots of discussion going home with multiple toddlers in the home. Advised mother to nap frequently and watch for signs of Mastitis.  Mother has a PIS Medela at home and she was given a harmony hand pump. Advised to continue to cue base and reviewed cluster feeding.   Mother was given Lactation brochure and informed of all available LC services at [facility].  Patient Name: Kerry Jimenez APOLI'D Date: 10/09/2018 Reason for consult: Initial assessment   Maternal Data    Feeding Feeding Type: Breast Fed  LATCH Score Latch: Grasps breast easily, tongue down, lips flanged, rhythmical sucking.  Audible Swallowing: Spontaneous and intermittent  Type of Nipple: Everted at rest and after stimulation  Comfort (Breast/Nipple): Soft / non-tender  Hold (Positioning): No assistance needed to correctly position infant at breast.  LATCH Score: 10  Interventions Interventions: Breast feeding basics reviewed;Assisted with latch;Skin to skin;Breast massage;Breast  compression;Adjust position;Support pillows;Position options;Expressed milk;Comfort gels;Hand pump  Lactation Tools Discussed/Used     Consult Status Consult Status: Complete    Michel Bickers 10/09/2018, 12:04 PM

## 2018-10-13 ENCOUNTER — Encounter: Payer: Medicaid Other | Admitting: Obstetrics and Gynecology

## 2018-10-19 ENCOUNTER — Inpatient Hospital Stay (HOSPITAL_COMMUNITY): Payer: Medicaid Other

## 2018-11-03 ENCOUNTER — Ambulatory Visit: Payer: Medicaid Other

## 2018-11-08 ENCOUNTER — Other Ambulatory Visit: Payer: Self-pay

## 2018-11-08 ENCOUNTER — Encounter: Payer: Self-pay | Admitting: Obstetrics and Gynecology

## 2018-11-08 ENCOUNTER — Telehealth (INDEPENDENT_AMBULATORY_CARE_PROVIDER_SITE_OTHER): Payer: Medicaid Other | Admitting: Obstetrics and Gynecology

## 2018-11-08 DIAGNOSIS — Z1389 Encounter for screening for other disorder: Secondary | ICD-10-CM

## 2018-11-08 NOTE — Progress Notes (Signed)
TELEHEALTH POSTPARTUM VIRTUAL VIDEO VISIT ENCOUNTER NOTE  Provider location: Center for [employer] [facility] at [place]   I connected with Virgilio Frees on 11/08/18 at  2:00 PM EDT by MyChart Video Encounter at home and verified that I am speaking with the correct person using two identifiers.    I discussed the limitations, risks, security and privacy concerns of performing an evaluation and management service by telephone and the availability of in person appointments. I also discussed with the patient that there may be a patient responsible charge related to this service. The patient expressed understanding and agreed to proceed.   Subjective:     Kerry Jimenez is a 23 y.o. female who presents for a postpartum visit. She is 4 weeks postpartum following a spontaneous vaginal delivery. I have fully reviewed the prenatal and intrapartum course. The delivery was at 39.2 gestational weeks. Outcome: spontaneous vaginal delivery. Anesthesia: epidural. Postpartum course has been Unremarkable. Baby's course has been Unremarkable. Baby is feeding by breast. Bleeding no bleeding. Bowel function is normal. Bladder function is normal. Patient is not sexually active. Contraception method is none. Postpartum depression screening: negative.     Review of Systems Pertinent items are noted in HPI.   Objective:    Breastfeeding Yes   Physical Exam:   General:  Alert, oriented and cooperative. Patient is in no acute distress sitting in her vehicle  Mental Status: Normal mood and affect. Normal behavior. Normal judgment and thought content.   Respiratory: Normal respiratory effort noted, no problems with respiration noted  Rest of physical exam deferred due to type of encounter  PP Depression Screening:   Edinburgh Postnatal Depression Scale - 11/08/18 1347      Edinburgh Postnatal Depression Scale:  In the Past 7 Days   I have been able to laugh and see the funny side of things.  0    I have  looked forward with enjoyment to things.  0    I have blamed myself unnecessarily when things went wrong.  0    I have been anxious or worried for no good reason.  0    I have felt scared or panicky for no good reason.  0    Things have been getting on top of me.  0    I have been so unhappy that I have had difficulty sleeping.  0    I have felt sad or miserable.  0    I have been so unhappy that I have been crying.  0    The thought of harming myself has occurred to me.  0    Edinburgh Postnatal Depression Scale Total  0       Assessment:Patient is a 23 y.o. [id number] who is 4 weeks postpartum from a normal spontaneous vaginal delivery.  She is doing very well.   Plan: Pap smear due November 2020 Patient is currently breastfeeding and plans to initiate Micronor for contraception. She is undecided on BTL at this time as she desires another son Patient is medically cleared to resume all activities of daily lving  RTC prn  I discussed the assessment and treatment plan with the patient. The patient was provided an opportunity to ask questions and all were answered. The patient agreed with the plan and demonstrated an understanding of the instructions.   The patient was advised to call back or seek an in-person evaluation/go to the ED for any concerning postpartum symptoms.  I provided 12 minutes of  face-to-face time during this encounter.   Catalina AntiguaPeggy Jaan Fischel, MD Center for [facility], [facility]

## 2018-11-18 ENCOUNTER — Other Ambulatory Visit: Payer: Self-pay

## 2018-11-18 DIAGNOSIS — N898 Other specified noninflammatory disorders of vagina: Secondary | ICD-10-CM

## 2018-11-18 MED ORDER — FLUCONAZOLE 150 MG PO TABS: 150.0000 mg | ORAL_TABLET | Freq: Once | ORAL | 0 refills | Status: DC

## 2018-11-18 NOTE — Progress Notes (Signed)
Rx sent per protocol  Pt made aware and confirmed pharmacy.

## 2018-12-06 ENCOUNTER — Other Ambulatory Visit: Payer: Self-pay | Admitting: Obstetrics

## 2018-12-06 DIAGNOSIS — N898 Other specified noninflammatory disorders of vagina: Secondary | ICD-10-CM

## 2018-12-26 ENCOUNTER — Other Ambulatory Visit: Payer: Self-pay | Admitting: *Deleted

## 2018-12-26 DIAGNOSIS — B373 Candidiasis of vulva and vagina: Secondary | ICD-10-CM

## 2018-12-26 DIAGNOSIS — B3731 Acute candidiasis of vulva and vagina: Secondary | ICD-10-CM

## 2018-12-26 MED ORDER — FLUCONAZOLE 150 MG PO TABS: 150.0000 mg | ORAL_TABLET | Freq: Once | ORAL | 0 refills | Status: DC

## 2018-12-26 NOTE — Progress Notes (Signed)
Pt called to office for tx for yeast infection. Diflucan sent per protocol.

## 2019-01-18 ENCOUNTER — Other Ambulatory Visit: Payer: Self-pay

## 2019-01-18 DIAGNOSIS — B3731 Acute candidiasis of vulva and vagina: Secondary | ICD-10-CM

## 2019-01-18 DIAGNOSIS — B373 Candidiasis of vulva and vagina: Secondary | ICD-10-CM

## 2019-01-18 MED ORDER — FLUCONAZOLE 150 MG PO TABS: 150.0000 mg | ORAL_TABLET | Freq: Once | ORAL | 1 refills | Status: AC

## 2019-01-18 NOTE — Progress Notes (Signed)
Rx diflucan refill sent per protocol.

## 2021-08-15 ENCOUNTER — Encounter: Payer: Self-pay | Admitting: Obstetrics

## 2021-08-15 ENCOUNTER — Ambulatory Visit (INDEPENDENT_AMBULATORY_CARE_PROVIDER_SITE_OTHER): Payer: Medicaid Other

## 2021-08-15 ENCOUNTER — Other Ambulatory Visit: Payer: Self-pay

## 2021-08-15 DIAGNOSIS — Z3200 Encounter for pregnancy test, result unknown: Secondary | ICD-10-CM

## 2021-08-15 LAB — POCT URINE PREGNANCY: Preg Test, Ur: POSITIVE — AB

## 2021-08-15 NOTE — Progress Notes (Signed)
Pt is in the office for UPT, results positive. LMP 07-04-21. Pt states that she is planning to terminate, just wanted to get dating information. Pt reports light cramping and spotting when she wipes after urination. Advised of miscarriage/ectopic precautions and to report to MAU as needed, pt agreed. ?

## 2021-09-02 NOTE — Progress Notes (Signed)
Patient was assessed and managed by nursing staff during this encounter. I have reviewed the chart and agree with the documentation and plan. I have also made any necessary editorial changes. ? ?Scheryl Darter, MD ?09/02/2021 1:23 PM  ? ?

## 2021-10-03 ENCOUNTER — Ambulatory Visit: Payer: Medicaid Other | Admitting: Obstetrics

## 2022-04-27 ENCOUNTER — Emergency Department (HOSPITAL_BASED_OUTPATIENT_CLINIC_OR_DEPARTMENT_OTHER): Payer: Self-pay

## 2022-04-27 ENCOUNTER — Emergency Department (HOSPITAL_BASED_OUTPATIENT_CLINIC_OR_DEPARTMENT_OTHER)
Admission: EM | Admit: 2022-04-27 | Discharge: 2022-04-27 | Disposition: A | Discharge status: Home / Self Care | Payer: Self-pay | Attending: Emergency Medicine | Admitting: Emergency Medicine

## 2022-04-27 ENCOUNTER — Other Ambulatory Visit: Payer: Self-pay

## 2022-04-27 ENCOUNTER — Encounter (HOSPITAL_BASED_OUTPATIENT_CLINIC_OR_DEPARTMENT_OTHER): Payer: Self-pay | Admitting: Pediatrics

## 2022-04-27 DIAGNOSIS — J45909 Unspecified asthma, uncomplicated: Secondary | ICD-10-CM | POA: Insufficient documentation

## 2022-04-27 DIAGNOSIS — R0981 Nasal congestion: Secondary | ICD-10-CM | POA: Insufficient documentation

## 2022-04-27 DIAGNOSIS — R0602 Shortness of breath: Secondary | ICD-10-CM | POA: Insufficient documentation

## 2022-04-27 DIAGNOSIS — Z1152 Encounter for screening for COVID-19: Secondary | ICD-10-CM | POA: Insufficient documentation

## 2022-04-27 DIAGNOSIS — R059 Cough, unspecified: Secondary | ICD-10-CM | POA: Insufficient documentation

## 2022-04-27 DIAGNOSIS — J069 Acute upper respiratory infection, unspecified: Secondary | ICD-10-CM

## 2022-04-27 LAB — RESP PANEL BY RT-PCR (FLU A&B, COVID) ARPGX2
Influenza A by PCR: NEGATIVE
Influenza B by PCR: NEGATIVE
SARS Coronavirus 2 by RT PCR: NEGATIVE

## 2022-04-27 NOTE — ED Triage Notes (Signed)
C/O cough since beginning of the month. Hx of asthma and uses albuterol; concern for worsening shortness of breath,

## 2022-04-27 NOTE — Discharge Instructions (Signed)
You have been seen today for your complaint of cough and shortness of breath. Your lab work was negative for flu or COVID. Your imaging was reassuring and showed no abnormalities. Your discharge medications include Claritin, Allegra, or Zyrtec.  You should get that "D" version of this for congestion.  You should follow dosing instructions on the bottle.  You should continue to use Flonase.. Follow up with: Dr. Craige Cotta with pulmonology.  You should call to schedule an appointment Please seek immediate medical care if you develop any of the following symptoms: You have shortness of breath that gets worse. You have severe or persistent: Headache. Ear pain. Sinus pain. Chest pain. You have chronic lung disease along with any of the following: Making high-pitched whistling sounds when you breathe, most often when you breathe out (wheezing). Prolonged cough (more than 14 days). Coughing up blood. A change in your usual mucus. You have a stiff neck. You have changes in your: Vision. Hearing. Thinking. Mood. At this time there does not appear to be the presence of an emergent medical condition, however there is always the potential for conditions to change. Please read and follow the below instructions.  Do not take your medicine if  develop an itchy rash, swelling in your mouth or lips, or difficulty breathing; call 911 and seek immediate emergency medical attention if this occurs.  You may review your lab tests and imaging results in their entirety on your MyChart account.  Please discuss all results of fully with your primary care provider and other specialist at your follow-up visit.  Note: Portions of this text may have been transcribed using voice recognition software. Every effort was made to ensure accuracy; however, inadvertent computerized transcription errors may still be present.

## 2022-04-27 NOTE — ED Provider Notes (Signed)
[facility] EMERGENCY DEPARTMENT Provider Note   CSN: [id number] Arrival date & time: 04/27/22  1221     History  Chief Complaint  Patient presents with   Cough    Kerry Jimenez is a 26 y.o. female.  With a history of asthma who presents ED for evaluation of cough and shortness of breath.  Patient states she developed a viral URI 10 days ago and has had a cough since that time.  Reports the cough is intermittent throughout the day, but worse when she lies down to sleep at night.  Cough is productive of clear to yellow sputum.  Reports she has nasal congestion and has just begun to improve, but now has liquid running down the back of her throat.  Patient has been using her rescue inhaler 4 times a day instead of her prescribed 2 times a day over the past week which has been concerning for her.  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  Patient does not have any shortness of breath at rest.  Denies fevers, chills, peripheral edema, chest pain.   Cough Associated symptoms: shortness of breath        Home Medications Prior to Admission medications   Medication Sig Start Date End Date Taking? Authorizing Provider  ibuprofen (ADVIL) 600 MG tablet Take 1 tablet (600 mg total) by mouth every 6 (six) hours. Patient not taking: Reported on 08/15/2021 10/09/18   Cresenzo-Dishmon, Scarlette Calico, CNM  norethindrone [facility]) 0.35 MG tablet Start on the Sunday after baby turns 38 weeks old Patient not taking: Reported on 08/15/2021 10/09/18   Jacklyn Shell, CNM      Allergies    Patient has no known allergies.    Review of Systems   Review of Systems  Respiratory:  Positive for cough and shortness of breath.   All other systems reviewed and are negative.   Physical Exam Updated Vital Signs BP 126/75 (BP Location: Left Arm)   Pulse 69   Temp 98.4 F (36.9 C) (Oral)   Resp 18   Ht 5' (1.524 m)   Wt 63.5 kg   LMP  03/23/2022   SpO2 100%   Breastfeeding Unknown   BMI 27.34 kg/m  Physical Exam Vitals and nursing note reviewed.  Constitutional:      General: She is not in acute distress.    Appearance: Normal appearance. She is normal weight. She is not ill-appearing.  HENT:     Head: Normocephalic and atraumatic.     Nose: Congestion present.     Mouth/Throat:     Mouth: Mucous membranes are moist.     Pharynx: Oropharynx is clear. No oropharyngeal exudate or posterior oropharyngeal erythema.     Comments: Posterior pharyngeal cobblestoning and postnasal drip Cardiovascular:     Rate and Rhythm: Normal rate and regular rhythm.     Pulses: Normal pulses.  Pulmonary:     Effort: Pulmonary effort is normal. No respiratory distress.     Breath sounds: Normal breath sounds. No stridor. No wheezing, rhonchi or rales.  Abdominal:     General: Abdomen is flat.  Musculoskeletal:        General: Normal range of motion.     Cervical back: Normal range of motion and neck supple.  Skin:    General: Skin is warm and dry.     Capillary Refill: Capillary refill takes less than 2 seconds.  Neurological:     General:  No focal deficit present.     Mental Status: She is alert and oriented to person, place, and time.  Psychiatric:        Mood and Affect: Mood normal.        Behavior: Behavior normal.     ED Results / Procedures / Treatments   Labs (all labs ordered are listed, but only abnormal results are displayed) Labs Reviewed  RESP PANEL BY RT-PCR (FLU A&B, COVID) ARPGX2    EKG None  Radiology DG Chest 2 View  Result Date: 04/27/2022 CLINICAL DATA:  Cough EXAM: CHEST - 2 VIEW COMPARISON:  09/30/2013 FINDINGS: The heart size and mediastinal contours are within normal limits. Both lungs are clear. The visualized skeletal structures are unremarkable. IMPRESSION: Normal chest radiographs. Electronically Signed   By: Duanne Guess D.O.   On: 04/27/2022 12:57    Procedures Procedures     Medications Ordered in ED Medications - No data to display  ED Course/ Medical Decision Making/ A&P                           Medical Decision Making Amount and/or Complexity of Data Reviewed Radiology: ordered.  This patient presents to the ED for concern of cough and shortness of breath with activity, this involves an extensive number of treatment options, and is a complaint that carries with it a high risk of complications and morbidity.  Differential diagnosis for emergent cause of cough includes but is not limited to upper respiratory infection, lower respiratory infection, allergies, asthma, irritants, foreign body, medications such as ACE inhibitors, reflux, asthma, CHF, lung cancer, interstitial lung disease, psychiatric causes, postnasal drip and postinfectious bronchospasm.    Co morbidities that complicate the patient evaluation   asthma  My initial workup includes chest x-ray, respiratory panel  Additional history obtained from: Nursing notes from this visit.  I ordered, reviewed and interpreted labs which include: Respiratory panel.  Negative.   I ordered imaging studies including chest x-ray I independently visualized and interpreted imaging which showed normal I agree with the radiologist interpretation  Afebrile, hemodynamically stable.  Patient is a 26 year old female who presents ED for evaluation of persistent intermittent cough and shortness of breath with activity.  Patient does have a history of asthma, but states her symptoms have been getting worse since she developed a URI 10 days ago.  Has been using her rescue inhaler more than she has had 2 in the past.  Physical exam remarkable for obvious postnasal drip.  Was otherwise unremarkable, specifically no adventitious breath sounds including wheezing.  Chest x-ray normal.  Respiratory panel negative.  Patient was offered IM Decadron, but declined due to fear of needles.  She was educated on the use of  Claritin-D and Flonase for her symptoms.  She was referred to pulmonology regarding her increasing asthma symptoms.  Cough likely secondary to postnasal drip.  Shortness of breath not at rest and I have low suspicion for acute pulmonary emergencies.  Stable at discharge.  At this time there does not appear to be any evidence of an acute emergency medical condition and the patient appears stable for discharge with appropriate outpatient follow up. Diagnosis was discussed with patient who verbalizes understanding of care plan and is agreeable to discharge. I have discussed return precautions with patient who verbalizes understanding. Patient encouraged to follow-up with their PCP within 1 week. All questions answered.  Patient's case discussed with Dr. Adela Lank who agrees with plan  to discharge with follow-up.   Note: Portions of this report may have been transcribed using voice recognition software. Every effort was made to ensure accuracy; however, inadvertent computerized transcription errors may still be present.          Final Clinical Impression(s) / ED Diagnoses Final diagnoses:  None    Rx / DC Orders ED Discharge Orders     None         Michelle Piper, PA-C 04/27/22 1422    Melene Plan, DO 04/27/22 [phone]

## 2022-10-12 ENCOUNTER — Emergency Department (HOSPITAL_BASED_OUTPATIENT_CLINIC_OR_DEPARTMENT_OTHER)
Admission: EM | Admit: 2022-10-12 | Discharge: 2022-10-12 | Disposition: A | Discharge status: Home / Self Care | Payer: Self-pay | Attending: Emergency Medicine | Admitting: Emergency Medicine

## 2022-10-12 ENCOUNTER — Encounter (HOSPITAL_BASED_OUTPATIENT_CLINIC_OR_DEPARTMENT_OTHER): Payer: Self-pay | Admitting: Emergency Medicine

## 2022-10-12 ENCOUNTER — Other Ambulatory Visit: Payer: Self-pay

## 2022-10-12 DIAGNOSIS — Z20822 Contact with and (suspected) exposure to covid-19: Secondary | ICD-10-CM | POA: Insufficient documentation

## 2022-10-12 DIAGNOSIS — J45909 Unspecified asthma, uncomplicated: Secondary | ICD-10-CM | POA: Insufficient documentation

## 2022-10-12 DIAGNOSIS — J020 Streptococcal pharyngitis: Secondary | ICD-10-CM | POA: Insufficient documentation

## 2022-10-12 HISTORY — DX: Unspecified asthma, uncomplicated: J45.909

## 2022-10-12 LAB — RESP PANEL BY RT-PCR (RSV, FLU A&B, COVID)  RVPGX2
Influenza A by PCR: NEGATIVE
Influenza B by PCR: NEGATIVE
Resp Syncytial Virus by PCR: NEGATIVE
SARS Coronavirus 2 by RT PCR: NEGATIVE

## 2022-10-12 LAB — GROUP A STREP BY PCR: Group A Strep by PCR: DETECTED — AB

## 2022-10-12 MED ORDER — DEXAMETHASONE SODIUM PHOSPHATE 10 MG/ML IJ SOLN
10.0000 mg | Freq: Once | INTRAMUSCULAR | Status: AC
  Administered 2022-10-12: 10 mg via INTRAMUSCULAR
  Filled 2022-10-12: qty 1

## 2022-10-12 MED ORDER — PENICILLIN G BENZATHINE 1200000 UNIT/2ML IM SUSY
1.2000 10*6.[IU] | PREFILLED_SYRINGE | Freq: Once | INTRAMUSCULAR | Status: AC
  Administered 2022-10-12: 1.2 10*6.[IU] via INTRAMUSCULAR
  Filled 2022-10-12: qty 2

## 2022-10-12 NOTE — ED Provider Notes (Signed)
[place] EMERGENCY DEPARTMENT AT [facility] Provider Note   CSN: [id number] Arrival date & time: 10/12/22  1646     History  Chief Complaint  Patient presents with   URI    [name] is a 27 y.o. female.  Patient with history of asthma presents today with complaints of sore throat. She states that her symptoms began a few days ago and has been persistent since. 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 She is able to swallow with some discomfort. Denies fevers, chills, chest pain, shortness of breath, nausea, vomiting.   The history is provided by the patient. No language interpreter was used.  URI Presenting symptoms: sore throat        Home Medications Prior to Admission medications   Medication Sig Start Date End Date Taking? Authorizing Provider  ibuprofen (ADVIL) 600 MG tablet Take 1 tablet (600 mg total) by mouth every 6 (six) hours. Patient not taking: Reported on 08/15/2021 10/09/18   Cresenzo-Dishmon, Scarlette Calico, CNM  norethindrone [facility]) 0.35 MG tablet Start on the Sunday after baby turns 69 weeks old Patient not taking: Reported on 08/15/2021 10/09/18   Jacklyn Shell, CNM      Allergies    Patient has no known allergies.    Review of Systems   Review of Systems  HENT:  Positive for sore throat.   All other systems reviewed and are negative.   Physical Exam Updated Vital Signs BP 130/87 (BP Location: Left Arm)   Pulse 62   Temp 99 F (37.2 C)   Resp 20   Wt 63.5 kg   SpO2 100%   BMI 27.34 kg/m  Physical Exam Vitals and nursing note reviewed.  Constitutional:      General: She is not in acute distress.    Appearance: Normal appearance. She is normal weight. She is not ill-appearing, toxic-appearing or diaphoretic.  HENT:     Head: Normocephalic and atraumatic.     Mouth/Throat:     Pharynx: Oropharynx is clear. Uvula midline.     Tonsils: No tonsillar exudate or tonsillar abscesses.  Neck:      Comments: No meningismus Cardiovascular:     Rate and Rhythm: Normal rate.  Pulmonary:     Effort: Pulmonary effort is normal. No respiratory distress.  Musculoskeletal:        General: Normal range of motion.     Cervical Jimenez: Normal range of motion and neck supple. No rigidity.  Lymphadenopathy:     Cervical: No cervical adenopathy.  Skin:    General: Skin is warm and dry.  Neurological:     General: No focal deficit present.     Mental Status: She is alert.  Psychiatric:        Mood and Affect: Mood normal.        Behavior: Behavior normal.     ED Results / Procedures / Treatments   Labs (all labs ordered are listed, but only abnormal results are displayed) Labs Reviewed  GROUP A STREP BY PCR - Abnormal; Notable for the following components:      Result Value   Group A Strep by PCR DETECTED (*)    All other components within normal limits  RESP PANEL BY RT-PCR (RSV, FLU A&B, COVID)  RVPGX2    EKG None  Radiology No results found.  Procedures Procedures    Medications Ordered in ED Medications  penicillin g benzathine (BICILLIN LA) 1200000 UNIT/2ML injection 1.2 Million Units (  1.2 Million Units Intramuscular Given 10/12/22 1804)  dexamethasone (DECADRON) injection 10 mg (10 mg Intramuscular Given 10/12/22 1803)    ED Course/ Medical Decision Making/ A&P                             Medical Decision Making Risk Prescription drug management.   Patient presents today with complaints of sore throat. She is tolerating secretions. Strep swab positive, symptoms consistent with same. Treated in the ED with steroids and PCN IM. Discussed importance of water rehydration. Presentation non concerning for PTA or RPA. No trismus or uvula deviation. Specific return precautions discussed. Pt able to drink water in ED without difficulty with intact air way. Evaluation and diagnostic testing in the emergency department does not suggest an emergent condition requiring admission  or immediate intervention beyond what has been performed at this time.  Plan for discharge with close PCP follow-up.  Patient is understanding and amenable with plan, educated on red flag symptoms that would prompt immediate return.  Patient discharged in stable condition.   Final Clinical Impression(s) / ED Diagnoses Final diagnoses:  Strep pharyngitis    Rx / DC Orders ED Discharge Orders     None     An After Visit Summary was printed and given to the patient.     Vear Clock 10/12/22 Alen Blew, MD 10/13/22 [phone]

## 2022-10-12 NOTE — Discharge Instructions (Signed)
As we discussed, you tested positive for strep throat today.  We have treated this with a shot of steroids and antibiotics.  You should start to feel better in the next few days.  In the interim, please take Tylenol/ibuprofen for pain and fevers.  Follow-up with your primary care doctor as needed.  You may return to work when you have been fever free for 24 hours.  Return if development of any new or worsening symptoms.

## 2022-10-12 NOTE — ED Triage Notes (Signed)
Runny noise , congestion , sore throat , headache , body ache . Hx asthma .

## 2022-12-19 ENCOUNTER — Encounter (HOSPITAL_BASED_OUTPATIENT_CLINIC_OR_DEPARTMENT_OTHER): Payer: Self-pay | Admitting: Emergency Medicine

## 2022-12-19 ENCOUNTER — Other Ambulatory Visit: Payer: Self-pay

## 2022-12-19 ENCOUNTER — Emergency Department (HOSPITAL_BASED_OUTPATIENT_CLINIC_OR_DEPARTMENT_OTHER)
Admission: EM | Admit: 2022-12-19 | Discharge: 2022-12-19 | Disposition: A | Discharge status: Home / Self Care | Payer: Self-pay | Attending: Emergency Medicine | Admitting: Emergency Medicine

## 2022-12-19 DIAGNOSIS — R0602 Shortness of breath: Secondary | ICD-10-CM | POA: Insufficient documentation

## 2022-12-19 DIAGNOSIS — R490 Dysphonia: Secondary | ICD-10-CM | POA: Insufficient documentation

## 2022-12-19 DIAGNOSIS — J45909 Unspecified asthma, uncomplicated: Secondary | ICD-10-CM | POA: Insufficient documentation

## 2022-12-19 NOTE — ED Provider Notes (Signed)
[place] EMERGENCY DEPARTMENT AT [facility] Provider Note   CSN: [id number] Arrival date & time: 12/19/22  1853     History  Chief Complaint  Patient presents with   Shortness of Breath    Kerry Jimenez is a 27 y.o. female.  The history is provided by the patient and medical records. No language interpreter was used.  Shortness of Breath    27 year old female significant history of anemia, asthma, presenting complaining of sinus congestion.  Patient mention she developed strep throat infection approximately 2 months ago.  She was seen by a provider who recommend several over-the-counter medication treatments which includes oregano oil, some other type of orals and some over-the-counter supplementation.  Since taking these medication for the past month, she noticed she is continued having some sinus congestion, hoarseness in her voice and dry mouth.  She stopped using these medication approximately a week ago but symptoms still persist thus prompting this ER visit.  She endorsed occasional wheezing and using her inhaler twice a day as needed.  She does not endorse any fever no neck pain no sore throat no abdominal pain.  She does vape.  She denies any runny nose sneezing or itchy eyes.  Home Medications Prior to Admission medications   Medication Sig Start Date End Date Taking? Authorizing Provider  ibuprofen (ADVIL) 600 MG tablet Take 1 tablet (600 mg total) by mouth every 6 (six) hours. Patient not taking: Reported on 08/15/2021 10/09/18   Cresenzo-Dishmon, Scarlette Calico, CNM  norethindrone [facility]) 0.35 MG tablet Start on the Sunday after baby turns 8 weeks old Patient not taking: Reported on 08/15/2021 10/09/18   Jacklyn Shell, CNM      Allergies    Patient has no known allergies.    Review of Systems   Review of Systems  Respiratory:  Positive for shortness of breath.   All other systems reviewed and are negative.   Physical Exam Updated Vital Signs BP  133/75   Pulse (!) 58   Temp 99.1 F (37.3 C) (Temporal)   Resp 20   Ht 5' (1.524 m)   Wt 63.5 kg   LMP 11/23/2022   SpO2 100%   BMI 27.34 kg/m  Physical Exam Vitals and nursing note reviewed.  Constitutional:      General: She is not in acute distress.    Appearance: She is well-developed.  HENT:     Head: Atraumatic.     Mouth/Throat:     Mouth: Mucous membranes are moist.     Pharynx: Oropharynx is clear. No pharyngeal swelling or oropharyngeal exudate.  Eyes:     Conjunctiva/sclera: Conjunctivae normal.  Cardiovascular:     Rate and Rhythm: Normal rate and regular rhythm.  Pulmonary:     Effort: Pulmonary effort is normal.     Breath sounds: No decreased breath sounds, wheezing, rhonchi or rales.  Musculoskeletal:     Cervical back: Normal range of motion and neck supple.  Skin:    Findings: No rash.  Neurological:     Mental Status: She is alert.  Psychiatric:        Mood and Affect: Mood normal.     ED Results / Procedures / Treatments   Labs (all labs ordered are listed, but only abnormal results are displayed) Labs Reviewed - No data to display  EKG None  Radiology No results found.  Procedures Procedures    Medications Ordered in ED Medications - No data to display  ED Course/ Medical Decision  Making/ A&P                             Medical Decision Making  BP 124/75   Pulse (!) 51   Temp 99.1 F (37.3 C) (Temporal)   Resp 20   Ht 5' (1.524 m)   Wt 63.5 kg   LMP 11/23/2022   SpO2 100%   BMI 27.34 kg/m   46:18 PM  27 year old female significant history of anemia, asthma, presenting complaining of sinus congestion.  Patient mention she developed strep throat infection approximately 2 months ago.  She was seen by a provider who recommend several over-the-counter medication treatments which includes oregano oil, some other type of orals and some over-the-counter supplementation.  Since taking these medication for the past month, she  noticed she is continued having some sinus congestion, hoarseness in her voice and dry mouth.  She stopped using these medication approximately a week ago but symptoms still persist thus prompting this ER visit.  She endorsed occasional wheezing and using her inhaler twice a day as needed.  She does not endorse any fever no neck pain no sore throat no abdominal pain.  She does vape.  She denies any runny nose sneezing or itchy eyes.  On exam patient is well-appearing appears to be in no acute discomfort.  She does have some mild hoarseness in her voice but otherwise throat exam unremarkable neck exam is nice and supple trachea is midline lungs are clear to auscultation no wheezes heart with normal rate and rhythm, vital signs overall reassuring.  No acute emergent medical condition 95.  DDx: Strep pharyngitis, mono, viral illness, seasonal allergies, pneumonia,  Reassurance given, I have considered chest x-ray but I have low suspicion for pneumonia and patient agrees.  Recommend cessation of over-the-counter medication and recommend supportive care.  Return precaution given.        Final Clinical Impression(s) / ED Diagnoses Final diagnoses:  Hoarseness    Rx / DC Orders ED Discharge Orders     None         Fayrene Helper, PA-C 12/19/22 2025    Loetta Rough, MD 12/19/22 2117

## 2022-12-19 NOTE — ED Triage Notes (Signed)
Pt c/o "my airway passages are closing up" x 1 mo; sts she thinks it is d/t taking oil of oregano daily; no resp distress noted

## 2022-12-19 NOTE — Discharge Instructions (Addendum)
You may discontinue using the over-the-counter medications that was previously recommended.  Continue to stay hydrated and drink plenty of fluid.  You may follow-up with ENT specialist outpatient for further evaluation of your hoarseness.

## 2023-05-17 ENCOUNTER — Ambulatory Visit: Payer: Self-pay | Admitting: Obstetrics & Gynecology

## 2023-09-01 ENCOUNTER — Other Ambulatory Visit: Payer: Self-pay

## 2023-09-01 ENCOUNTER — Emergency Department (HOSPITAL_BASED_OUTPATIENT_CLINIC_OR_DEPARTMENT_OTHER)
Admission: EM | Admit: 2023-09-01 | Discharge: 2023-09-01 | Disposition: A | Discharge status: Home / Self Care | Payer: Self-pay | Attending: Emergency Medicine | Admitting: Emergency Medicine

## 2023-09-01 DIAGNOSIS — R0981 Nasal congestion: Secondary | ICD-10-CM | POA: Insufficient documentation

## 2023-09-01 DIAGNOSIS — J45909 Unspecified asthma, uncomplicated: Secondary | ICD-10-CM | POA: Insufficient documentation

## 2023-09-01 DIAGNOSIS — R590 Localized enlarged lymph nodes: Secondary | ICD-10-CM | POA: Insufficient documentation

## 2023-09-01 DIAGNOSIS — R059 Cough, unspecified: Secondary | ICD-10-CM | POA: Insufficient documentation

## 2023-09-01 DIAGNOSIS — R591 Generalized enlarged lymph nodes: Secondary | ICD-10-CM

## 2023-09-01 LAB — RESP PANEL BY RT-PCR (RSV, FLU A&B, COVID)  RVPGX2
Influenza A by PCR: NEGATIVE
Influenza B by PCR: NEGATIVE
Resp Syncytial Virus by PCR: NEGATIVE
SARS Coronavirus 2 by RT PCR: NEGATIVE

## 2023-09-01 NOTE — ED Triage Notes (Signed)
 Lump right submandibular area noticed 2 days ago. Feels like is may have gotten smaller over past two days. Some pain with palpation. Irritated with spicy foods. Denies sore throat. Children had COVID last week. Denies flu symptoms.

## 2023-09-01 NOTE — ED Provider Notes (Signed)
 [place] EMERGENCY DEPARTMENT AT [facility] Provider Note   CSN: [id number] Arrival date & time: 09/01/23  1634     History  No chief complaint on file.   Kerry Jimenez is a 28 y.o. female presents with right-sided facial lump noticed 2 days ago.  Feels like it has shrunk some since she first noticed today.  Endorses some mild discomfort to palpation.  Denies any other new nodules.  States that last week she had a cough and congestion and her children had COVID.  Currently she is asymptomatic.  HPI    Past Medical History:  Diagnosis Date   Anemia    Asthma      Home Medications Prior to Admission medications   Medication Sig Start Date End Date Taking? Authorizing Provider  ibuprofen (ADVIL) 600 MG tablet Take 1 tablet (600 mg total) by mouth every 6 (six) hours. Patient not taking: Reported on 08/15/2021 10/09/18   Cresenzo-Dishmon, Scarlette Calico, CNM  norethindrone [facility]) 0.35 MG tablet Start on the Sunday after baby turns 81 weeks old Patient not taking: Reported on 08/15/2021 10/09/18   Jacklyn Shell, CNM      Allergies    Patient has no known allergies.    Review of Systems   Review of Systems  Constitutional:  Negative for fever.    Physical Exam Updated Vital Signs BP 127/82 (BP Location: Right Arm)   Pulse 80   Temp 99.7 F (37.6 C)   Resp 16   SpO2 100%  Physical Exam Vitals and nursing note reviewed.  Constitutional:      General: She is not in acute distress.    Appearance: She is well-developed.  HENT:     Head: Normocephalic and atraumatic.  Eyes:     Conjunctiva/sclera: Conjunctivae normal.  Neck:     Comments: Right-sided cervical lymphadenopathy with mild associated tenderness  Appreciable supraclavicular or axillary Cardiovascular:     Rate and Rhythm: Normal rate and regular rhythm.     Heart sounds: No murmur heard. Pulmonary:     Effort: Pulmonary effort is normal. No respiratory distress.     Breath sounds:  Normal breath sounds.  Abdominal:     Palpations: Abdomen is soft.  Musculoskeletal:        General: No swelling.     Cervical back: Neck supple.  Skin:    General: Skin is warm and dry.     Capillary Refill: Capillary refill takes less than 2 seconds.  Neurological:     Mental Status: She is alert.  Psychiatric:        Mood and Affect: Mood normal.     ED Results / Procedures / Treatments   Labs (all labs ordered are listed, but only abnormal results are displayed) Labs Reviewed  RESP PANEL BY RT-PCR (RSV, FLU A&B, COVID)  RVPGX2    EKG None  Radiology No results found.  Procedures Procedures    Medications Ordered in ED Medications - No data to display  ED Course/ Medical Decision Making/ A&P                                 Medical Decision Making  This patient presents to the ED with chief complaint(s) of nodule.  The complaint involves an extensive differential diagnosis and also carries with it a high risk of complications and morbidity.   pertinent past medical history as listed in HPI  The differential diagnosis  includes  Lymphadenopathy, abscess, malignancy  Additional history obtained: Records reviewed Care Everywhere/External Records  Initial Assessment:   Hemodynamically stable, nontoxic-appearing patient presenting with right-sided cervical nodule she noticed a few days ago.  Symptoms seem to be improving.  It is tender to palpation consistent with lymphadenopathy.  She is otherwise asymptomatic.  No other nodules or lymph nodes appreciated.  There is no overlying fluctuance, induration, erythema or warmth to suggest infectious process.  She is otherwise healthy.  Lower suspicion for malignancy.  Will provide PCP follow-up should symptoms not improve within the next few weeks.  Independent ECG interpretation:  none  Independent labs interpretation:  The following labs were independently interpreted:  Respiratory panel negative  Independent  visualization and interpretation of imaging: none  Treatment and Reassessment: none  Consultations obtained:   none  Disposition:   Patient will be discharged home provided PCP follow-up. The patient has been appropriately medically screened and/or stabilized in the ED. I have low suspicion for any other emergent medical condition which would require further screening, evaluation or treatment in the ED or require inpatient management. At time of discharge the patient is hemodynamically stable and in no acute distress. I have discussed work-up results and diagnosis with patient and answered all questions. Patient is agreeable with discharge plan. We discussed strict return precautions for returning to the emergency department and they verbalized understanding.     Social Determinants of Health:   Patient's impaired access to primary care  increases the complexity of managing their presentation  This note was dictated with voice recognition software.  Despite best efforts at proofreading, errors may have occurred which can change the documentation meaning.          Final Clinical Impression(s) / ED Diagnoses Final diagnoses:  Lymphadenopathy    Rx / DC Orders ED Discharge Orders     None         Fabienne Bruns 09/01/23 1945    Lorre Nick, MD 09/03/23 1112

## 2023-09-01 NOTE — ED Notes (Signed)
 Discharge instructions reviewed.   Opportunity for questions and concerns provided.   Alert, oriented and ambulatory. Displays no signs of distress.

## 2023-09-01 NOTE — Discharge Instructions (Addendum)
 You were evaluated in the emergency room for a nodule.  This is consistent with a lymph node which will normally become enlarged and tender in the setting of a virus or even allergies.  Expect this to resolve.  If your symptoms have not improved within the next few weeks please follow-up with your primary care doctor.

## 2024-03-03 ENCOUNTER — Emergency Department (HOSPITAL_BASED_OUTPATIENT_CLINIC_OR_DEPARTMENT_OTHER)
Admission: EM | Admit: 2024-03-03 | Discharge: 2024-03-03 | Disposition: A | Discharge status: Home / Self Care | Payer: Self-pay | Attending: Emergency Medicine | Admitting: Emergency Medicine

## 2024-03-03 ENCOUNTER — Encounter (HOSPITAL_BASED_OUTPATIENT_CLINIC_OR_DEPARTMENT_OTHER): Payer: Self-pay | Admitting: Emergency Medicine

## 2024-03-03 ENCOUNTER — Emergency Department (HOSPITAL_BASED_OUTPATIENT_CLINIC_OR_DEPARTMENT_OTHER): Payer: Self-pay

## 2024-03-03 ENCOUNTER — Other Ambulatory Visit: Payer: Self-pay

## 2024-03-03 DIAGNOSIS — O209 Hemorrhage in early pregnancy, unspecified: Secondary | ICD-10-CM | POA: Insufficient documentation

## 2024-03-03 DIAGNOSIS — Z3A00 Weeks of gestation of pregnancy not specified: Secondary | ICD-10-CM | POA: Diagnosis not present

## 2024-03-03 DIAGNOSIS — O4690 Antepartum hemorrhage, unspecified, unspecified trimester: Secondary | ICD-10-CM

## 2024-03-03 LAB — CBC WITH DIFFERENTIAL/PLATELET
Abs Immature Granulocytes: 0.07 K/uL (ref 0.00–0.07)
Basophils Absolute: 0.1 K/uL (ref 0.0–0.1)
Basophils Relative: 1 %
Eosinophils Absolute: 0.1 K/uL (ref 0.0–0.5)
Eosinophils Relative: 1 %
HCT: 37 % (ref 36.0–46.0)
Hemoglobin: 11.9 g/dL — ABNORMAL LOW (ref 12.0–15.0)
Immature Granulocytes: 1 %
Lymphocytes Relative: 38 %
Lymphs Abs: 2.3 K/uL (ref 0.7–4.0)
MCH: 26.8 pg (ref 26.0–34.0)
MCHC: 32.2 g/dL (ref 30.0–36.0)
MCV: 83.3 fL (ref 80.0–100.0)
Monocytes Absolute: 0.4 K/uL (ref 0.1–1.0)
Monocytes Relative: 7 %
Neutro Abs: 3.1 K/uL (ref 1.7–7.7)
Neutrophils Relative %: 52 %
Platelets: 270 K/uL (ref 150–400)
RBC: 4.44 MIL/uL (ref 3.87–5.11)
RDW: 15.7 % — ABNORMAL HIGH (ref 11.5–15.5)
WBC: 6 K/uL (ref 4.0–10.5)
nRBC: 0 % (ref 0.0–0.2)

## 2024-03-03 LAB — URINALYSIS, ROUTINE W REFLEX MICROSCOPIC
Bacteria, UA: NONE SEEN
Bilirubin Urine: NEGATIVE
Glucose, UA: NEGATIVE mg/dL
Hgb urine dipstick: NEGATIVE
Ketones, ur: NEGATIVE mg/dL
Nitrite: NEGATIVE
Protein, ur: NEGATIVE mg/dL
Specific Gravity, Urine: 1.017 (ref 1.005–1.030)
pH: 6.5 (ref 5.0–8.0)

## 2024-03-03 LAB — COMPREHENSIVE METABOLIC PANEL WITH GFR
ALT: 6 U/L (ref 0–44)
AST: 14 U/L — ABNORMAL LOW (ref 15–41)
Albumin: 4.4 g/dL (ref 3.5–5.0)
Alkaline Phosphatase: 57 U/L (ref 38–126)
Anion gap: 10 (ref 5–15)
BUN: 5 mg/dL — ABNORMAL LOW (ref 6–20)
CO2: 24 mmol/L (ref 22–32)
Calcium: 9.7 mg/dL (ref 8.9–10.3)
Chloride: 105 mmol/L (ref 98–111)
Creatinine, Ser: 0.78 mg/dL (ref 0.44–1.00)
GFR, Estimated: >60 mL/min (ref >60)
Glucose, Bld: 72 mg/dL (ref 70–99)
Potassium: 3.9 mmol/L (ref 3.5–5.1)
Sodium: 139 mmol/L (ref 135–145)
Total Bilirubin: 0.7 mg/dL (ref 0.0–1.2)
Total Protein: 7.2 g/dL (ref 6.5–8.1)

## 2024-03-03 LAB — PREGNANCY, URINE: Preg Test, Ur: POSITIVE — AB

## 2024-03-03 LAB — HCG, QUANTITATIVE, PREGNANCY: hCG, Beta Chain, Quant, S: 60 m[IU]/mL — ABNORMAL HIGH (ref <5)

## 2024-03-03 NOTE — ED Notes (Signed)
 Reviewed AVS/discharge instruction with patient. Time allotted for and all questions answered. Patient is agreeable for d/c and escorted to ed exit by staff.

## 2024-03-03 NOTE — Discharge Instructions (Addendum)
 You appear to likely have an early pregnancy.  Your pregnancy hormone today was 60 which is very low but positive.  Your ultrasound did not show a pregnancy in your uterus; we would not expect to see a pregnancy at this time.  However, we cannot rule out that the pregnancy is in the tubes or in the wrong place and this is going to require follow-up.  There are no signs of complication or other problems on the ultrasound.  Please call your OB/GYN and arrange for follow-up.  You will likely need your pregnancy hormone level rechecked.  If you have worsening bleeding, severe pain, or other concerns, please go to the [facility] MAU at [facility] campus or return to the ED.

## 2024-03-03 NOTE — ED Triage Notes (Signed)
 Patient states abdominal pain. States she had miscarriage last month the day she was going to get an abortion. States several days ago she began having abdominal cramping with vaginal spotting. Has not taken home pregnancy test.

## 2024-03-03 NOTE — ED Provider Notes (Signed)
 [place] EMERGENCY DEPARTMENT AT [facility] Provider Note   CSN: [id number] Arrival date & time: 03/03/24  1056     Patient presents with: Abdominal Pain   Kerry Jimenez is a 28 y.o. female.   Patient presents to the emergency department today for evaluation of lower abdominal pain and cramping, spotting.  Patient reports having a spontaneous miscarriage 3-4 weeks ago.  She states that she had approximately 5 days of bleeding and some residual soreness, but returned to her normal state of health.  During this event, she visually confirmed likely passage of tissue.  About a week ago she started having sharp stabbing pains in the lower abdomen.  No urinary symptoms or fevers.  She then developed spotting about 4 days ago.  No heavy bleeding or passage of clots.  She reports a previous discharge, yellow in color, that has resolved and not currently present.  She is sexually active with 1 partner.  She states that she could potentially be pregnant again.  She has not taken a pregnancy test.  No fevers.       Prior to Admission medications   Medication Sig Start Date End Date Taking? Authorizing Provider  ibuprofen  (ADVIL ) 600 MG tablet Take 1 tablet (600 mg total) by mouth every 6 (six) hours. Patient not taking: Reported on 08/15/2021 10/09/18   Cresenzo-Dishmon, Cathlean, CNM  norethindrone  (MICRONOR ) 0.35 MG tablet Start on the Sunday after baby turns 64 weeks old Patient not taking: Reported on 08/15/2021 10/09/18   Cresenzo-Dishmon, Frances, CNM    Allergies: Patient has no known allergies.    Review of Systems  Updated Vital Signs BP 115/76   Pulse 77   Temp 99.2 F (37.3 C) (Oral)   Resp 16   SpO2 100%   Physical Exam Vitals and nursing note reviewed.  Constitutional:      Appearance: She is well-developed.  HENT:     Head: Normocephalic and atraumatic.  Eyes:     Conjunctiva/sclera: Conjunctivae normal.  Pulmonary:     Effort: No respiratory distress.   Abdominal:     Tenderness: There is abdominal tenderness in the periumbilical area and suprapubic area. There is no guarding or rebound. Negative signs include Murphy's sign and McBurney's sign.  Musculoskeletal:     Cervical back: Normal range of motion and neck supple.  Skin:    General: Skin is warm and dry.  Neurological:     Mental Status: She is alert.     (all labs ordered are listed, but only abnormal results are displayed) Labs Reviewed  CBC WITH DIFFERENTIAL/PLATELET - Abnormal; Notable for the following components:      Result Value   Hemoglobin 11.9 (*)    RDW 15.7 (*)    All other components within normal limits  COMPREHENSIVE METABOLIC PANEL WITH GFR - Abnormal; Notable for the following components:   BUN 5 (*)    AST 14 (*)    All other components within normal limits  URINALYSIS, ROUTINE W REFLEX MICROSCOPIC - Abnormal; Notable for the following components:   Leukocytes,Ua TRACE (*)    All other components within normal limits  PREGNANCY, URINE - Abnormal; Notable for the following components:   Preg Test, Ur POSITIVE (*)    All other components within normal limits  HCG, QUANTITATIVE, PREGNANCY - Abnormal; Notable for the following components:   hCG, Beta Chain, Quant, S 60 (*)    All other components within normal limits    EKG: None  Radiology:  US  OB Comp < 14 Wks Result Date: 03/03/2024 CLINICAL DATA:  Pelvic pain.  Vaginal bleeding.  Positive beta hCG. EXAM: OBSTETRIC <14 WK US  AND TRANSVAGINAL OB US  TECHNIQUE: Both transabdominal and transvaginal ultrasound examinations were performed for complete evaluation of the gestation as well as the maternal uterus, adnexal regions, and pelvic cul-de-sac. Transvaginal technique was performed to assess early pregnancy. COMPARISON:  None Available. FINDINGS: Intrauterine gestational sac: None Maternal uterus/adnexae: Endometrial thickness measures 12 mm. Heterogeneous uterine myometrium, without No evidence of  discrete fibroids. Normal appearance of right ovary. Simple left ovarian cyst seen measuring 4.3 x 3.6 x 3.3 cm. No suspicious adnexal mass or abnormal free fluid identified. IMPRESSION: Pregnancy of unknown anatomic location (no intrauterine gestational sac or adnexal mass identified). Differential diagnosis includes recent spontaneous abortion, IUP too early to visualize, and non-visualized ectopic pregnancy. Recommend followup of beta-hCG levels, and follow up US  as warranted clinically. 4.3 cm benign-appearing left ovarian cyst. Electronically Signed   By: Norleen DELENA Kil M.D.   On: 03/03/2024 14:15   US  OB Transvaginal Result Date: 03/03/2024 CLINICAL DATA:  Pelvic pain.  Vaginal bleeding.  Positive beta hCG. EXAM: OBSTETRIC <14 WK US  AND TRANSVAGINAL OB US  TECHNIQUE: Both transabdominal and transvaginal ultrasound examinations were performed for complete evaluation of the gestation as well as the maternal uterus, adnexal regions, and pelvic cul-de-sac. Transvaginal technique was performed to assess early pregnancy. COMPARISON:  None Available. FINDINGS: Intrauterine gestational sac: None Maternal uterus/adnexae: Endometrial thickness measures 12 mm. Heterogeneous uterine myometrium, without No evidence of discrete fibroids. Normal appearance of right ovary. Simple left ovarian cyst seen measuring 4.3 x 3.6 x 3.3 cm. No suspicious adnexal mass or abnormal free fluid identified. IMPRESSION: Pregnancy of unknown anatomic location (no intrauterine gestational sac or adnexal mass identified). Differential diagnosis includes recent spontaneous abortion, IUP too early to visualize, and non-visualized ectopic pregnancy. Recommend followup of beta-hCG levels, and follow up US  as warranted clinically. 4.3 cm benign-appearing left ovarian cyst. Electronically Signed   By: Norleen DELENA Kil M.D.   On: 03/03/2024 14:15     Procedures   Medications Ordered in the ED - No data to display  ED Course  Patient seen and  examined. History obtained directly from patient.   Labs/EKG: Ordered CBC, CMP, UA, urine pregnancy  Imaging: None ordered, will consider imaging if pregnant.  Medications/Fluids: None ordered  Most recent vital signs reviewed and are as follows: BP 115/76   Pulse 77   Temp 99.2 F (37.3 C) (Oral)   Resp 16   SpO2 100%   Initial impression: Lower abdominal and pelvic pain, recent miscarriage but returned to usual state of health in the interim without any bleeding.  12:12 PM Reassessment performed. Patient appears stable.  Labs personally reviewed and interpreted including: CBC with normal white blood cell count, mild anemia hemoglobin 11.9 otherwise unremarkable; CMP pending; UA without compelling signs of infection.  Pregnancy is positive, per charge RN initially read as negative, but there was apparently slight color change.  Quant ordered.  Imaging ordered pelvic ultrasound to evaluate bleeding and pain in possible early pregnancy, also to ensure no complications from miscarriage that reportedly occurred 4 weeks ago.  Reviewed pertinent lab work and imaging with patient at bedside. Questions answered.   Most current vital signs reviewed and are as follows: BP 115/76   Pulse 77   Temp 99.2 F (37.3 C) (Oral)   Resp 16   SpO2 100%   Plan: Awaiting quant, imaging, CMP.  ///  Reassessment performed. Patient appears stable and comfortable.  Labs personally reviewed and interpreted including: Quantitative hCG was only 60.  CMP unremarkable.  Imaging results reviewed including pelvic ultrasound which does not show intrauterine pregnancy, but however given low quantitative hCG, unlikely to see at this point.  This is considered a pregnancy of unknown anatomic location and patient will need to follow-up.  Reviewed pertinent lab work and imaging with patient at bedside. Questions answered.   Most current vital signs reviewed and are as follows: BP 116/80   Pulse 71   Temp  99.2 F (37.3 C) (Oral)   Resp 16   SpO2 100%   Plan: Discharge to home.   Prescriptions written for: None  Other home care instructions discussed: Close monitoring of symptoms  ED return instructions discussed: Return with heavier bleeding, severe uncontrolled pain, lightheadedness or syncope, new or worsening symptoms  Follow-up instructions discussed: Patient encouraged to follow-up with their OB/GYN in 1 week.  Patient states that she will reach out for follow-up.                                  Medical Decision Making Amount and/or Complexity of Data Reviewed Labs: ordered. Radiology: ordered.   Patient with some vaginal spotting and lower abdominal cramping and pain.  She had a miscarriage about 4 weeks ago.  She is fairly certain that she saw passage of fetus at that time.  Her symptoms resolved completely.  Now with new symptoms.  Question of menstrual pain and recurrence of period, however patient was found to be pregnant today.  She states that she did have unprotected intercourse about 6 days ago.  Quantitative hCG today is only 60.  No complications from previous miscarriage identified on ultrasound and this is likely an early pregnancy.  Patient will require outpatient follow-up to confirm progression of pregnancy and for ultrasound to ensure IUP at appropriate time, if symptoms persist.  Patient has appropriate outpatient follow-up and will reach out for follow-up.  She seems reliable to return with any worsening.  No concern for significant blood loss today.     Final diagnoses:  Vaginal bleeding in pregnancy    ED Discharge Orders     None          Desiderio Chew, DEVONNA 03/03/24 1954    Levander Houston, MD 03/06/24 1214

## 2024-03-08 ENCOUNTER — Encounter (HOSPITAL_COMMUNITY): Payer: Self-pay | Admitting: Obstetrics and Gynecology

## 2024-03-08 ENCOUNTER — Inpatient Hospital Stay (HOSPITAL_COMMUNITY)
Admission: AD | Admit: 2024-03-08 | Discharge: 2024-03-08 | Disposition: A | Discharge status: Home / Self Care | Payer: Self-pay | Attending: Obstetrics and Gynecology | Admitting: Obstetrics and Gynecology

## 2024-03-08 ENCOUNTER — Other Ambulatory Visit: Payer: Self-pay

## 2024-03-08 DIAGNOSIS — O039 Complete or unspecified spontaneous abortion without complication: Secondary | ICD-10-CM | POA: Diagnosis not present

## 2024-03-08 DIAGNOSIS — O209 Hemorrhage in early pregnancy, unspecified: Secondary | ICD-10-CM | POA: Diagnosis present

## 2024-03-08 DIAGNOSIS — N939 Abnormal uterine and vaginal bleeding, unspecified: Secondary | ICD-10-CM

## 2024-03-08 DIAGNOSIS — Z3A12 12 weeks gestation of pregnancy: Secondary | ICD-10-CM

## 2024-03-08 LAB — URINALYSIS, ROUTINE W REFLEX MICROSCOPIC
Bacteria, UA: NONE SEEN
Bilirubin Urine: NEGATIVE
Glucose, UA: NEGATIVE mg/dL
Ketones, ur: NEGATIVE mg/dL
Leukocytes,Ua: NEGATIVE
Nitrite: NEGATIVE
Protein, ur: NEGATIVE mg/dL
Specific Gravity, Urine: 1.019 (ref 1.005–1.030)
pH: 6 (ref 5.0–8.0)

## 2024-03-08 LAB — CBC
HCT: 36.4 % (ref 36.0–46.0)
Hemoglobin: 11.5 g/dL — ABNORMAL LOW (ref 12.0–15.0)
MCH: 26.6 pg (ref 26.0–34.0)
MCHC: 31.6 g/dL (ref 30.0–36.0)
MCV: 84.3 fL (ref 80.0–100.0)
Platelets: 259 K/uL (ref 150–400)
RBC: 4.32 MIL/uL (ref 3.87–5.11)
RDW: 15.5 % (ref 11.5–15.5)
WBC: 6.5 K/uL (ref 4.0–10.5)
nRBC: 0 % (ref 0.0–0.2)

## 2024-03-08 LAB — POCT PREGNANCY, URINE: Preg Test, Ur: POSITIVE — AB

## 2024-03-08 LAB — HCG, QUANTITATIVE, PREGNANCY: hCG, Beta Chain, Quant, S: 47 m[IU]/mL — ABNORMAL HIGH (ref <5)

## 2024-03-08 MED ORDER — ACETAMINOPHEN-CAFFEINE 500-65 MG PO TABS: 2.0000 | ORAL_TABLET | Freq: Once | ORAL | Status: DC

## 2024-03-08 NOTE — Discharge Instructions (Addendum)
 It is important that we check your blood levels until they return to 0 to ensure that there are no tissues left in the uterus that may cause an infection. We will check those blood levels at the office once a week until they reach 0.  I am so sorry that this is happening. You may experience many different emotions while you grieve, and it is normal to have emotional stress after a pregnancy loss. Emotional recovery can take longer than physical recovery. There is nothing that you did or did not do to cause this to happen; it is not your fault. Please reach out if you need support, we are here for you. You can also join a group to help work through your grief: [email or web address]  EARLY PREGNANCY LOSS Once the egg is fertilized with the sperm and begins to develop, it attaches to the lining of the uterus. This early pregnancy tissue may not develop into an embryo (the beginning stage of a baby). Sometimes an embryo does develop but does not continue to grow. These problems can be seen on ultrasound.  About 1 in 4 women will have a pregnancy loss in her lifetime.  One in five pregnancies is found to be an early pregnancy failure.    WHAT TO EXPECT AS THE PREGNANCY PASSES: Cramping   Bleeding Headaches   Dizziness To help with these symptoms, prescriptions for three medications. You can take ibuprofen  for pain. You can also take Tylenol  for pain if you prefer.  AFTER THE PREGNANCY PASSES: Everybody will feel differently after the early pregnancy passes.  You may have soreness or cramps for a day or two.  You may have soreness or cramps for day or two.   You may have light bleeding for up to 2 weeks.  You may be as active as you feel like being. You should have a follow-up exam with your OBGYN in about 2 weeks to ensure you are healing  properly. Your next period is expected to start again about 4-6 weeks afterward. It is possible to get pregnant soon after a loss.   Reasons to return to MAU at [facility]: If you have heavier bleeding that soaks through more that 2 full-size pads per hour for an hour or more If you bleed so much that you feel like you might pass out or you do pass out If you feel lightheaded or dizzy If you have significant abdominal pain that is not improved with Tylenol  1000 mg every 8 hours as needed for pain If you develop a fever > 100.5  If you have foul-smelling vaginal discharge

## 2024-03-08 NOTE — MAU Note (Signed)
 Kerry Jimenez is a 28 y.o. at Unknown here in MAU reporting: she had a miscarriage on August 28th, went to [facility] on 03/03/2024 for abdominal pain and VB.  Reports was told she was pregnant again, this a new pregnancy.  States an ultrasound was done, told too early to see pregnancy.  States she now is having light/moderate VB with light abdominal cramping.  Also reporting HA for last 2 weeks that's not resolved with Tylenol   LMP: 12/09/2023 Onset of complaint: 03/03/2024 Pain score: 3 There were no vitals filed for this visit.   FHT: NA  Lab orders placed from triage: None

## 2024-03-08 NOTE — MAU Provider Note (Signed)
 Chief Complaint:  Abdominal Pain and Vaginal Bleeding   HPI   None     Kerry Jimenez is a 28 y.o. [id number] at [redacted]w[redacted]d who presents to maternity admissions reporting ongoing abdominal pain and vaginal bleeding.  She was evaluated at [facility] ED on 03/03/2024 for the same.  She reported what she believed was a spontaneous miscarriage around 02/10/2024 with approximately 5 days of bleeding including passage of tissue/clots.  She did not have any clinical confirmation of pregnancy or confirmation of complete miscarriage.  She then developed sharp stabbing lower abdominal pains around 02/25/2024 and started spotting about 5 days later.  In the ED, workup significant for hemoglobin 11.9, hCG 60, and ultrasound with no evidence of IUP.  She continues to have abdominal pain, it is improved but not resolved with Tylenol .  Pregnancy Course: No routine OBGYN care. No clinical confirmation of pregnancy in August.  Past Medical History:  Diagnosis Date   Anemia    Asthma    OB History  Gravida Para Term Preterm AB Living  5 3 3  0 0 3  SAB IAB Ectopic Multiple Live Births  0 0 0 0 3    # Outcome Date GA Lbr Len/2nd Weight Sex Type Anes PTL Lv  5 Current           4 Term 10/07/18 [redacted]w[redacted]d 07:25 / 00:09 3274 g M Vag-Spont EPI  LIV  3 Term 07/13/17 [redacted]w[redacted]d 03:40 / 00:19 3085 g F Vag-Spont EPI  LIV     Birth Comments: WNL  2 Term 08/26/15 [redacted]w[redacted]d 26:35 / 00:24 3184 g F Vag-Spont EPI  LIV  1 Gravida            Past Surgical History:  Procedure Laterality Date   NO PAST SURGERIES     Family History  Problem Relation Age of Onset   Diabetes Mother    Hypertension Mother    Hyperlipidemia Mother    Arthritis Mother    Sickle cell anemia Mother    Hypertension Other    Diabetes Other    Cancer Paternal Grandmother    Sickle cell anemia Brother    Arthritis Maternal Aunt    Heart disease Maternal Uncle    Heart disease Paternal Aunt    Sickle cell trait Brother    Hyperlipidemia Sister    Arthritis  Maternal Aunt    Hyperlipidemia Maternal Aunt    Diabetes Paternal Aunt    Diabetes Paternal Aunt    Social History   Tobacco Use   Smoking status: Never   Smokeless tobacco: Never  Vaping Use   Vaping status: Never Used  Substance Use Topics   Alcohol use: Yes    Alcohol/week: 0.0 standard drinks of alcohol    Comment: occassionally, not since positive pregnancy test   Drug use: Yes    Types: Marijuana   No Known Allergies No medications prior to admission.    I have reviewed patient's Past Medical Hx, Surgical Hx, Family Hx, Social Hx, medications and allergies.   ROS  Pertinent items noted in HPI and remainder of comprehensive ROS otherwise negative.   PHYSICAL EXAM  Patient Vitals for the past 24 hrs:  BP Temp Temp src Pulse Resp SpO2 Height Weight  03/08/24 1307 124/79 98.3 F (36.8 C) Oral 61 18 100 % -- --  03/08/24 1258 -- -- -- -- -- -- 5' (1.524 m) 55.6 kg    Constitutional: Well-developed, well-nourished female in no acute distress.  HEENT: atraumatic, normocephalic.  Neck has normal ROM. EOM intact. Cardiovascular: normal rate & rhythm, warm and well-perfused Respiratory: normal effort, no problems with respiration noted GI: Abd soft, non-tender, non-distended MSK: Extremities nontender, no edema, normal ROM Skin: warm and dry. Acyanotic, no jaundice or pallor. Neurologic: Alert and oriented x 4. No abnormal coordination. Psychiatric: Normal mood. Speech not slurred, not rapid/pressured. Patient is cooperative. GU: no CVA tenderness Pelvic exam: deferred  Labs: Results for orders placed or performed during the hospital encounter of 03/08/24 (from the past 24 hours)  Pregnancy, urine POC     Status: Abnormal   Collection Time: 03/08/24  1:17 PM  Result Value Ref Range   Preg Test, Ur POSITIVE (A) NEGATIVE  Urinalysis, Routine w reflex microscopic -Urine, Clean Catch     Status: Abnormal   Collection Time: 03/08/24  1:18 PM  Result Value Ref Range    Color, Urine YELLOW YELLOW   APPearance CLEAR CLEAR   Specific Gravity, Urine 1.019 1.005 - 1.030   pH 6.0 5.0 - 8.0   Glucose, UA NEGATIVE NEGATIVE mg/dL   Hgb urine dipstick MODERATE (A) NEGATIVE   Bilirubin Urine NEGATIVE NEGATIVE   Ketones, ur NEGATIVE NEGATIVE mg/dL   Protein, ur NEGATIVE NEGATIVE mg/dL   Nitrite NEGATIVE NEGATIVE   Leukocytes,Ua NEGATIVE NEGATIVE   RBC / HPF 0-5 0 - 5 RBC/hpf   WBC, UA 0-5 0 - 5 WBC/hpf   Bacteria, UA NONE SEEN NONE SEEN   Squamous Epithelial / HPF 0-5 0 - 5 /HPF   Mucus PRESENT   CBC     Status: Abnormal   Collection Time: 03/08/24  1:27 PM  Result Value Ref Range   WBC 6.5 4.0 - 10.5 K/uL   RBC 4.32 3.87 - 5.11 MIL/uL   Hemoglobin 11.5 (L) 12.0 - 15.0 g/dL   HCT 63.5 63.9 - 53.9 %   MCV 84.3 80.0 - 100.0 fL   MCH 26.6 26.0 - 34.0 pg   MCHC 31.6 30.0 - 36.0 g/dL   RDW 84.4 88.4 - 84.4 %   Platelets 259 150 - 400 K/uL   nRBC 0.0 0.0 - 0.2 %  hCG, quantitative, pregnancy     Status: Abnormal   Collection Time: 03/08/24  1:27 PM  Result Value Ref Range   hCG, Beta Chain, Quant, S 47 (H) <5 mIU/mL   Imaging:  No results found.  MDM & MAU COURSE  MDM: High  MAU Course: -Vital signs within normal limits. -Urine pregnancy test positive, will repeat CBC and hCG. -CBC stable. -hCG now 47, declining.  -No indication for repeat US  with no new symptoms and declining hCG. US  on 9/19 negative for IUP, showed thickened endometrium of 12mm. Also showed simple left ovarian cyst which may be contributing to pain.  -Discussed that we cannot be certain if this is retained POC from initial bleeding episode on 8/28 or a new SAB. No prior clinical confirmation of pregnancy, no imaging confirming IUP since LMP on 12/09/2023.  -Discussed with Dr. Cleatus, trend hCG with weekly testing until 0.  Differential diagnosis considered for vaginal bleeding includes but is not limited to: return of menses, ectopic pregnancy, complete spontaneous abortion,  incomplete abortion, missed abortion, threatened abortion, embryonic/fetal demise, cervical insufficiency, cervical or vaginal disorder   Orders Placed This Encounter  Procedures   Urinalysis, Routine w reflex microscopic -Urine, Clean Catch   CBC   hCG, quantitative, pregnancy   Pregnancy, urine POC   Discharge patient   Meds ordered this encounter  Medications   acetaminophen -caffeine  (EXCEDRIN TENSION HEADACHE) 500-65 MG per tablet 2 tablet    ASSESSMENT   1. Vaginal bleeding   2. SAB (spontaneous abortion)     PLAN  Discharge home in stable condition with return precautions.  Office lab appointment for repeat hCG at [facility] on 03/15/2024. Continue weekly hCG until 0. Alternate acetaminophen  and ibuprofen  for pain management. Discussed findings of simple cyst on US , recommend follow up with OBGYN in 2-3 months if needed. Patient is waiting on Medicaid approval.   Follow-up Information     [facility] for [facility] Follow up in 5 day(s).   Specialty: Obstetrics and Gynecology Why: repeat blood work Contact information: [address] [place]  [address] [phone]                 Allergies as of 03/08/2024   No Known Allergies      Medication List    You have not been prescribed any medications.      Joesph DELENA Sear, PA

## 2024-03-13 ENCOUNTER — Other Ambulatory Visit: Payer: Self-pay

## 2024-03-13 ENCOUNTER — Ambulatory Visit: Payer: Self-pay

## 2024-03-15 ENCOUNTER — Other Ambulatory Visit: Payer: Self-pay

## 2024-03-15 DIAGNOSIS — O039 Complete or unspecified spontaneous abortion without complication: Secondary | ICD-10-CM

## 2024-03-16 LAB — BETA HCG QUANT (REF LAB): hCG Quant: 13 m[IU]/mL

## 2024-03-17 ENCOUNTER — Telehealth: Payer: Self-pay

## 2024-03-17 ENCOUNTER — Ambulatory Visit: Payer: Self-pay | Admitting: Obstetrics and Gynecology

## 2024-03-22 ENCOUNTER — Other Ambulatory Visit: Payer: Self-pay

## 2024-03-22 DIAGNOSIS — N939 Abnormal uterine and vaginal bleeding, unspecified: Secondary | ICD-10-CM

## 2024-03-22 DIAGNOSIS — O039 Complete or unspecified spontaneous abortion without complication: Secondary | ICD-10-CM

## 2024-03-23 LAB — BETA HCG QUANT (REF LAB): hCG Quant: 8 m[IU]/mL

## 2024-03-24 ENCOUNTER — Ambulatory Visit: Payer: Self-pay | Admitting: Obstetrics and Gynecology

## 2024-03-29 ENCOUNTER — Telehealth: Payer: Self-pay

## 2024-03-29 NOTE — Telephone Encounter (Signed)
 S/w pt to make aware of hcg level and need for follow up appt in 1-2 weeks, notified schedulers to contact

## 2024-04-05 ENCOUNTER — Other Ambulatory Visit: Payer: Self-pay

## 2024-04-06 ENCOUNTER — Ambulatory Visit: Payer: Self-pay | Admitting: Obstetrics & Gynecology

## 2024-04-06 LAB — BETA HCG QUANT (REF LAB): hCG Quant: 2 m[IU]/mL

## 2024-04-12 ENCOUNTER — Other Ambulatory Visit: Payer: Self-pay

## 2024-04-12 DIAGNOSIS — O039 Complete or unspecified spontaneous abortion without complication: Secondary | ICD-10-CM

## 2024-04-13 ENCOUNTER — Encounter: Payer: Self-pay | Admitting: Obstetrics and Gynecology

## 2024-04-13 ENCOUNTER — Ambulatory Visit: Payer: Self-pay | Admitting: Obstetrics and Gynecology

## 2024-04-13 LAB — BETA HCG QUANT (REF LAB): hCG Quant: 1 m[IU]/mL

## 2024-04-19 ENCOUNTER — Other Ambulatory Visit: Payer: Self-pay

## 2024-05-01 ENCOUNTER — Ambulatory Visit (INDEPENDENT_AMBULATORY_CARE_PROVIDER_SITE_OTHER): Admitting: Student

## 2024-05-01 ENCOUNTER — Encounter: Payer: Self-pay | Admitting: Student

## 2024-05-01 VITALS — BP 120/74 | HR 54 | Ht 60.0 in | Wt 134.2 lb

## 2024-05-01 DIAGNOSIS — Z3202 Encounter for pregnancy test, result negative: Secondary | ICD-10-CM | POA: Diagnosis not present

## 2024-05-01 DIAGNOSIS — N83202 Unspecified ovarian cyst, left side: Secondary | ICD-10-CM | POA: Diagnosis not present

## 2024-05-01 DIAGNOSIS — N912 Amenorrhea, unspecified: Secondary | ICD-10-CM

## 2024-05-01 LAB — POCT URINE PREGNANCY: Preg Test, Ur: NEGATIVE

## 2024-05-01 NOTE — Progress Notes (Unsigned)
 Pt c/o pain from ovarian cyst.  Pt was recently pregnancy. Last HCG 10-29 Pt reports random spotting since June  Pt need medical release to return to work Last PAP 2020

## 2024-05-01 NOTE — Progress Notes (Unsigned)
  History:  Ms. Brazil Voytko is a 28 y.o. [phone] who presents to clinic today for follow-up from episode of abdominal pain last week that occurred during work. She has not had pain since and has no other symptoms. Her place of employment is now requesting that she be cleared to return work.  She reports a miscarriage around 02/10/2024 with approximately 5 days of bleeding including passage of tissue/clots.  She did not have any clinical confirmation of pregnancy at that time. She then developed sharp stabbing lower abdominal pains around 02/25/2024 and started spotting about 5 days later.  She presented to ED, workup significant for hemoglobin 11.9, hCG 60, and ultrasound with no evidence of IUP.  Ultrasound was positive for simple cyst. hCG was trended to zero (0) on 10/29.   The following portions of the patient's history were reviewed and updated as appropriate: allergies, current medications, family history, past medical history, social history, past surgical history and problem list.  Review of Systems:  ROS    Objective:  Physical Exam BP 120/74   Pulse (!) 54   Ht 5' (1.524 m)   Wt 134 lb 3.2 oz (60.9 kg)   LMP 12/09/2023   Breastfeeding No   BMI 26.21 kg/m  Physical Exam    Labs and Imaging No results found for this or any previous visit (from the past 24 hours).  No results found.  Health Maintenance Due  Topic Date Due   Hepatitis C Screening  Never done   DTaP/Tdap/Td (1 - Tdap) Never done   Hepatitis B Vaccines 19-59 Average Risk (1 of 3 - 19+ 3-dose series) Never done   Cervical Cancer Screening (Pap smear)  10/09/2018   HPV VACCINES (1 - 3-dose SCDM series) Never done   Influenza Vaccine  01/14/2024   COVID-19 Vaccine (1 - 2025-26 season) Never done    Labs, imaging and previous visits in Epic and Care Everywhere reviewed  Assessment & Plan:  1. Possible pregnancy (Primary)   Approximately *** minutes of total time was spent with this patient on  ***  Return in about 2 months (around 07/01/2024) for ANN.  Hoyle Garre, NP 05/01/2024 11:51 AM

## 2024-05-16 ENCOUNTER — Ambulatory Visit: Admitting: Obstetrics and Gynecology
